# Patient Record
Sex: Female | Born: 1970 | Race: White | Hispanic: No | Marital: Married
Health system: Southern US, Community
[De-identification: ages and names within clinical notes are randomized; demographics above are authoritative.]

## PROBLEM LIST (undated history)

## (undated) HISTORY — PX: ABDOMINAL HYSTERECTOMY: SHX81

## (undated) HISTORY — PX: BACK SURGERY: SHX140

## (undated) HISTORY — PX: APPENDECTOMY: SHX54

## (undated) HISTORY — PX: IMPLANTATION / PLACEMENT EPIDURAL NEUROSTIMULATOR ELECTRODES: SUR687

## (undated) HISTORY — PX: GALLBLADDER SURGERY: SHX652

---

## 2004-11-05 ENCOUNTER — Ambulatory Visit: Payer: Self-pay | Admitting: Hematology

## 2005-04-29 ENCOUNTER — Ambulatory Visit: Payer: Self-pay | Admitting: Oncology

## 2009-01-16 ENCOUNTER — Encounter
Admission: RE | Admit: 2009-01-16 | Discharge: 2009-01-16 | Payer: Self-pay | Admitting: Physical Medicine and Rehabilitation

## 2009-03-20 ENCOUNTER — Inpatient Hospital Stay (HOSPITAL_COMMUNITY): Admission: RE | Admit: 2009-03-20 | Discharge: 2009-03-22 | Payer: Self-pay | Admitting: Orthopedic Surgery

## 2009-07-15 ENCOUNTER — Encounter: Admission: RE | Admit: 2009-07-15 | Discharge: 2009-07-15 | Payer: Self-pay | Admitting: Orthopedic Surgery

## 2010-04-09 ENCOUNTER — Encounter: Admission: RE | Admit: 2010-04-09 | Discharge: 2010-04-09 | Payer: Self-pay | Admitting: Orthopedic Surgery

## 2010-09-23 LAB — TYPE AND SCREEN
ABO/RH(D): O NEG
Antibody Screen: NEGATIVE

## 2010-09-23 LAB — ABO/RH: ABO/RH(D): O NEG

## 2010-09-24 LAB — CBC
HCT: 38 % (ref 36.0–46.0)
Platelets: 346 10*3/uL (ref 150–400)
RDW: 12.8 % (ref 11.5–15.5)

## 2010-09-24 LAB — HCG, SERUM, QUALITATIVE: Preg, Serum: NEGATIVE

## 2011-10-26 ENCOUNTER — Other Ambulatory Visit: Payer: Self-pay | Admitting: Orthopedic Surgery

## 2011-10-26 DIAGNOSIS — M545 Low back pain, unspecified: Secondary | ICD-10-CM

## 2011-11-01 ENCOUNTER — Ambulatory Visit
Admission: RE | Admit: 2011-11-01 | Discharge: 2011-11-01 | Disposition: A | Discharge status: Home / Self Care | Payer: BC Managed Care – HMO | Source: Ambulatory Visit | Attending: Orthopedic Surgery | Admitting: Orthopedic Surgery

## 2011-11-01 VITALS — BP 111/68 | HR 72 | Ht 64.0 in | Wt 175.0 lb

## 2011-11-01 DIAGNOSIS — M545 Low back pain, unspecified: Secondary | ICD-10-CM

## 2011-11-01 MED ORDER — DIAZEPAM 5 MG PO TABS
10.0000 mg | ORAL_TABLET | Freq: Once | ORAL | Status: AC
  Administered 2011-11-01: 10 mg via ORAL

## 2011-11-01 MED ORDER — ONDANSETRON HCL 4 MG/2ML IJ SOLN
4.0000 mg | Freq: Once | INTRAMUSCULAR | Status: AC
  Administered 2011-11-01: 4 mg via INTRAMUSCULAR

## 2011-11-01 MED ORDER — MEPERIDINE HCL 100 MG/ML IJ SOLN
75.0000 mg | Freq: Once | INTRAMUSCULAR | Status: AC
  Administered 2011-11-01: 75 mg via INTRAMUSCULAR

## 2011-11-01 MED ORDER — IOHEXOL 180 MG/ML  SOLN
17.0000 mL | Freq: Once | INTRAMUSCULAR | Status: AC | PRN
  Administered 2011-11-01: 17 mL via INTRATHECAL

## 2011-11-01 NOTE — Discharge Instructions (Signed)
Myelogram Discharge Instructions  1. Go home and rest quietly for the next 24 hours.  It is important to lie flat for the next 24 hours.  Get up only to go to the restroom.  You may lie in the bed or on a couch on your back, your stomach, your left side or your right side.  You may have one pillow under your head.  You may have pillows between your knees while you are on your side or under your knees while you are on your back.  2. DO NOT drive today.  Recline the seat as far back as it will go, while still wearing your seat belt, on the way home.  3. You may get up to go to the bathroom as needed.  You may sit up for 10 minutes to eat.  You may resume your normal diet and medications unless otherwise indicated.  Drink lots of extra fluids today and tomorrow.  4. The incidence of headache, nausea, or vomiting is about 5% (one in 20 patients).  If you develop a headache, lie flat and drink plenty of fluids until the headache goes away.  Caffeinated beverages may be helpful.  If you develop severe nausea and vomiting or a headache that does not go away with flat bed rest, call [phone].  5. You may resume normal activities after your 24 hours of bed rest is over; however, do not exert yourself strongly or do any heavy lifting tomorrow. If when you get up you have a headache when standing, go back to bed and force fluids for another 24 hours.  6. Call your physician for a follow-up appointment.  The results of your myelogram will be sent directly to your physician by the following day.  7. If you have any questions or if complications develop after you arrive home, please call [phone].  Discharge instructions have been explained to the patient.  The patient, or the person responsible for the patient, fully understands these instructions.       May resume sumatriptan on Nov 02, 2011, after 11:00 am.

## 2011-11-01 NOTE — Progress Notes (Signed)
Patient states she has been off Sumatriptan for the past two days.  Procedure and discharge instructions explained to patient.  Larina Earthly, RN

## 2013-09-30 ENCOUNTER — Other Ambulatory Visit: Payer: Self-pay | Admitting: Pain Medicine

## 2013-09-30 ENCOUNTER — Other Ambulatory Visit (HOSPITAL_COMMUNITY): Payer: Self-pay | Admitting: Pain Medicine

## 2013-09-30 ENCOUNTER — Ambulatory Visit
Admission: RE | Admit: 2013-09-30 | Discharge: 2013-09-30 | Disposition: A | Discharge status: Home / Self Care | Payer: Medicare Other | Source: Ambulatory Visit | Attending: Pain Medicine | Admitting: Pain Medicine

## 2013-09-30 DIAGNOSIS — M549 Dorsalgia, unspecified: Secondary | ICD-10-CM

## 2013-11-18 ENCOUNTER — Other Ambulatory Visit: Payer: Self-pay | Admitting: Pain Medicine

## 2013-11-18 DIAGNOSIS — M79604 Pain in right leg: Secondary | ICD-10-CM

## 2013-11-18 DIAGNOSIS — M545 Low back pain, unspecified: Secondary | ICD-10-CM

## 2013-11-21 ENCOUNTER — Ambulatory Visit (HOSPITAL_COMMUNITY)
Admission: RE | Admit: 2013-11-21 | Discharge: 2013-11-21 | Disposition: A | Discharge status: Home / Self Care | Payer: Medicare Other | Source: Ambulatory Visit | Attending: Pain Medicine | Admitting: Pain Medicine

## 2013-11-21 DIAGNOSIS — M79604 Pain in right leg: Secondary | ICD-10-CM

## 2013-11-21 DIAGNOSIS — M5126 Other intervertebral disc displacement, lumbar region: Secondary | ICD-10-CM | POA: Insufficient documentation

## 2013-11-21 DIAGNOSIS — Z981 Arthrodesis status: Secondary | ICD-10-CM | POA: Insufficient documentation

## 2013-11-21 DIAGNOSIS — M545 Low back pain, unspecified: Secondary | ICD-10-CM | POA: Insufficient documentation

## 2013-11-21 MED ORDER — GADOBENATE DIMEGLUMINE 529 MG/ML IV SOLN
17.0000 mL | Freq: Once | INTRAVENOUS | Status: AC | PRN
  Administered 2013-11-21: 17 mL via INTRAVENOUS

## 2013-11-24 ENCOUNTER — Other Ambulatory Visit: Payer: Medicare Other

## 2013-11-28 ENCOUNTER — Ambulatory Visit
Admission: RE | Admit: 2013-11-28 | Discharge: 2013-11-28 | Disposition: A | Discharge status: Home / Self Care | Payer: Medicare Other | Source: Ambulatory Visit | Attending: Pain Medicine | Admitting: Pain Medicine

## 2013-11-28 ENCOUNTER — Other Ambulatory Visit: Payer: Self-pay | Admitting: Pain Medicine

## 2013-11-28 DIAGNOSIS — M25551 Pain in right hip: Secondary | ICD-10-CM

## 2013-11-28 DIAGNOSIS — M25552 Pain in left hip: Principal | ICD-10-CM

## 2014-03-07 ENCOUNTER — Telehealth: Payer: Self-pay | Admitting: Cardiology

## 2014-03-07 NOTE — Telephone Encounter (Signed)
Walk In Pt Form " Tax Forms & Bank Info" Dropped Off gave to Ivy   9.18.15/km

## 2015-04-08 ENCOUNTER — Other Ambulatory Visit (HOSPITAL_COMMUNITY): Payer: Self-pay | Admitting: Internal Medicine

## 2015-04-08 DIAGNOSIS — E2749 Other adrenocortical insufficiency: Secondary | ICD-10-CM

## 2015-04-08 DIAGNOSIS — R51 Headache: Secondary | ICD-10-CM

## 2015-04-08 DIAGNOSIS — R519 Headache, unspecified: Secondary | ICD-10-CM

## 2015-04-23 ENCOUNTER — Ambulatory Visit (HOSPITAL_COMMUNITY)
Admission: RE | Admit: 2015-04-23 | Discharge: 2015-04-23 | Disposition: A | Discharge status: Home / Self Care | Payer: Medicare Other | Source: Ambulatory Visit | Attending: Internal Medicine | Admitting: Internal Medicine

## 2015-04-23 ENCOUNTER — Encounter (HOSPITAL_COMMUNITY): Payer: Self-pay

## 2015-04-23 DIAGNOSIS — R519 Headache, unspecified: Secondary | ICD-10-CM

## 2015-04-23 DIAGNOSIS — R51 Headache: Secondary | ICD-10-CM | POA: Insufficient documentation

## 2015-04-23 DIAGNOSIS — G4700 Insomnia, unspecified: Secondary | ICD-10-CM | POA: Insufficient documentation

## 2015-04-23 DIAGNOSIS — E2749 Other adrenocortical insufficiency: Secondary | ICD-10-CM | POA: Diagnosis not present

## 2015-04-23 MED ORDER — GADOBENATE DIMEGLUMINE 529 MG/ML IV SOLN
9.0000 mL | Freq: Once | INTRAVENOUS | Status: AC | PRN
  Administered 2015-04-23: 9 mL via INTRAVENOUS

## 2015-08-05 DIAGNOSIS — G894 Chronic pain syndrome: Secondary | ICD-10-CM | POA: Diagnosis not present

## 2015-08-05 DIAGNOSIS — M5417 Radiculopathy, lumbosacral region: Secondary | ICD-10-CM | POA: Diagnosis not present

## 2015-08-05 DIAGNOSIS — Z79899 Other long term (current) drug therapy: Secondary | ICD-10-CM | POA: Diagnosis not present

## 2015-08-05 DIAGNOSIS — M5137 Other intervertebral disc degeneration, lumbosacral region: Secondary | ICD-10-CM | POA: Diagnosis not present

## 2015-08-05 DIAGNOSIS — M47817 Spondylosis without myelopathy or radiculopathy, lumbosacral region: Secondary | ICD-10-CM | POA: Diagnosis not present

## 2015-08-19 DIAGNOSIS — M5137 Other intervertebral disc degeneration, lumbosacral region: Secondary | ICD-10-CM | POA: Diagnosis not present

## 2015-08-28 DIAGNOSIS — G2581 Restless legs syndrome: Secondary | ICD-10-CM | POA: Diagnosis not present

## 2015-08-28 DIAGNOSIS — E242 Drug-induced Cushing's syndrome: Secondary | ICD-10-CM | POA: Diagnosis not present

## 2015-08-28 DIAGNOSIS — Z5181 Encounter for therapeutic drug level monitoring: Secondary | ICD-10-CM | POA: Diagnosis not present

## 2015-08-28 DIAGNOSIS — E2749 Other adrenocortical insufficiency: Secondary | ICD-10-CM | POA: Diagnosis not present

## 2015-09-02 DIAGNOSIS — M5137 Other intervertebral disc degeneration, lumbosacral region: Secondary | ICD-10-CM | POA: Diagnosis not present

## 2015-09-02 DIAGNOSIS — E669 Obesity, unspecified: Secondary | ICD-10-CM | POA: Diagnosis not present

## 2015-09-02 DIAGNOSIS — M5417 Radiculopathy, lumbosacral region: Secondary | ICD-10-CM | POA: Diagnosis not present

## 2015-09-02 DIAGNOSIS — M47817 Spondylosis without myelopathy or radiculopathy, lumbosacral region: Secondary | ICD-10-CM | POA: Diagnosis not present

## 2015-09-02 DIAGNOSIS — G894 Chronic pain syndrome: Secondary | ICD-10-CM | POA: Diagnosis not present

## 2015-09-02 DIAGNOSIS — Z79899 Other long term (current) drug therapy: Secondary | ICD-10-CM | POA: Diagnosis not present

## 2015-09-10 DIAGNOSIS — J029 Acute pharyngitis, unspecified: Secondary | ICD-10-CM | POA: Diagnosis not present

## 2015-09-16 DIAGNOSIS — M5137 Other intervertebral disc degeneration, lumbosacral region: Secondary | ICD-10-CM | POA: Diagnosis not present

## 2015-09-30 DIAGNOSIS — E669 Obesity, unspecified: Secondary | ICD-10-CM | POA: Diagnosis not present

## 2015-09-30 DIAGNOSIS — M5137 Other intervertebral disc degeneration, lumbosacral region: Secondary | ICD-10-CM | POA: Diagnosis not present

## 2015-09-30 DIAGNOSIS — Z79899 Other long term (current) drug therapy: Secondary | ICD-10-CM | POA: Diagnosis not present

## 2015-09-30 DIAGNOSIS — M25559 Pain in unspecified hip: Secondary | ICD-10-CM | POA: Diagnosis not present

## 2015-09-30 DIAGNOSIS — M47817 Spondylosis without myelopathy or radiculopathy, lumbosacral region: Secondary | ICD-10-CM | POA: Diagnosis not present

## 2015-09-30 DIAGNOSIS — G894 Chronic pain syndrome: Secondary | ICD-10-CM | POA: Diagnosis not present

## 2015-09-30 DIAGNOSIS — Z79891 Long term (current) use of opiate analgesic: Secondary | ICD-10-CM | POA: Diagnosis not present

## 2015-10-29 DIAGNOSIS — G894 Chronic pain syndrome: Secondary | ICD-10-CM | POA: Diagnosis not present

## 2015-10-29 DIAGNOSIS — M5137 Other intervertebral disc degeneration, lumbosacral region: Secondary | ICD-10-CM | POA: Diagnosis not present

## 2015-10-29 DIAGNOSIS — Z79891 Long term (current) use of opiate analgesic: Secondary | ICD-10-CM | POA: Diagnosis not present

## 2015-10-29 DIAGNOSIS — M47817 Spondylosis without myelopathy or radiculopathy, lumbosacral region: Secondary | ICD-10-CM | POA: Diagnosis not present

## 2015-10-29 DIAGNOSIS — M25559 Pain in unspecified hip: Secondary | ICD-10-CM | POA: Diagnosis not present

## 2015-10-29 DIAGNOSIS — Z79899 Other long term (current) drug therapy: Secondary | ICD-10-CM | POA: Diagnosis not present

## 2015-11-26 DIAGNOSIS — Z79899 Other long term (current) drug therapy: Secondary | ICD-10-CM | POA: Diagnosis not present

## 2015-11-26 DIAGNOSIS — E669 Obesity, unspecified: Secondary | ICD-10-CM | POA: Diagnosis not present

## 2015-11-26 DIAGNOSIS — M5137 Other intervertebral disc degeneration, lumbosacral region: Secondary | ICD-10-CM | POA: Diagnosis not present

## 2015-11-26 DIAGNOSIS — G894 Chronic pain syndrome: Secondary | ICD-10-CM | POA: Diagnosis not present

## 2015-11-26 DIAGNOSIS — Z79891 Long term (current) use of opiate analgesic: Secondary | ICD-10-CM | POA: Diagnosis not present

## 2015-11-26 DIAGNOSIS — M25559 Pain in unspecified hip: Secondary | ICD-10-CM | POA: Diagnosis not present

## 2015-11-26 DIAGNOSIS — M47817 Spondylosis without myelopathy or radiculopathy, lumbosacral region: Secondary | ICD-10-CM | POA: Diagnosis not present

## 2015-12-02 DIAGNOSIS — M7060 Trochanteric bursitis, unspecified hip: Secondary | ICD-10-CM | POA: Diagnosis not present

## 2015-12-07 DIAGNOSIS — Z5181 Encounter for therapeutic drug level monitoring: Secondary | ICD-10-CM | POA: Diagnosis not present

## 2015-12-07 DIAGNOSIS — E242 Drug-induced Cushing's syndrome: Secondary | ICD-10-CM | POA: Diagnosis not present

## 2015-12-07 DIAGNOSIS — E2749 Other adrenocortical insufficiency: Secondary | ICD-10-CM | POA: Diagnosis not present

## 2015-12-07 DIAGNOSIS — G2581 Restless legs syndrome: Secondary | ICD-10-CM | POA: Diagnosis not present

## 2015-12-08 DIAGNOSIS — R232 Flushing: Secondary | ICD-10-CM | POA: Diagnosis not present

## 2015-12-08 DIAGNOSIS — E039 Hypothyroidism, unspecified: Secondary | ICD-10-CM | POA: Diagnosis not present

## 2015-12-08 DIAGNOSIS — I10 Essential (primary) hypertension: Secondary | ICD-10-CM | POA: Diagnosis not present

## 2015-12-08 DIAGNOSIS — G2581 Restless legs syndrome: Secondary | ICD-10-CM | POA: Diagnosis not present

## 2015-12-09 DIAGNOSIS — N951 Menopausal and female climacteric states: Secondary | ICD-10-CM | POA: Diagnosis not present

## 2015-12-09 DIAGNOSIS — E876 Hypokalemia: Secondary | ICD-10-CM | POA: Diagnosis not present

## 2015-12-09 DIAGNOSIS — I10 Essential (primary) hypertension: Secondary | ICD-10-CM | POA: Diagnosis not present

## 2015-12-09 DIAGNOSIS — Z79899 Other long term (current) drug therapy: Secondary | ICD-10-CM | POA: Diagnosis not present

## 2015-12-09 DIAGNOSIS — D649 Anemia, unspecified: Secondary | ICD-10-CM | POA: Diagnosis not present

## 2015-12-09 DIAGNOSIS — G2581 Restless legs syndrome: Secondary | ICD-10-CM | POA: Diagnosis not present

## 2015-12-09 DIAGNOSIS — E785 Hyperlipidemia, unspecified: Secondary | ICD-10-CM | POA: Diagnosis not present

## 2015-12-09 DIAGNOSIS — E039 Hypothyroidism, unspecified: Secondary | ICD-10-CM | POA: Diagnosis not present

## 2015-12-24 DIAGNOSIS — G894 Chronic pain syndrome: Secondary | ICD-10-CM | POA: Diagnosis not present

## 2015-12-24 DIAGNOSIS — Z79899 Other long term (current) drug therapy: Secondary | ICD-10-CM | POA: Diagnosis not present

## 2015-12-24 DIAGNOSIS — E669 Obesity, unspecified: Secondary | ICD-10-CM | POA: Diagnosis not present

## 2015-12-24 DIAGNOSIS — M47817 Spondylosis without myelopathy or radiculopathy, lumbosacral region: Secondary | ICD-10-CM | POA: Diagnosis not present

## 2015-12-24 DIAGNOSIS — Z79891 Long term (current) use of opiate analgesic: Secondary | ICD-10-CM | POA: Diagnosis not present

## 2015-12-24 DIAGNOSIS — M25559 Pain in unspecified hip: Secondary | ICD-10-CM | POA: Diagnosis not present

## 2015-12-24 DIAGNOSIS — M5137 Other intervertebral disc degeneration, lumbosacral region: Secondary | ICD-10-CM | POA: Diagnosis not present

## 2016-01-14 DIAGNOSIS — R195 Other fecal abnormalities: Secondary | ICD-10-CM | POA: Diagnosis not present

## 2016-01-14 DIAGNOSIS — Z5181 Encounter for therapeutic drug level monitoring: Secondary | ICD-10-CM | POA: Diagnosis not present

## 2016-01-14 DIAGNOSIS — E242 Drug-induced Cushing's syndrome: Secondary | ICD-10-CM | POA: Diagnosis not present

## 2016-01-14 DIAGNOSIS — E2749 Other adrenocortical insufficiency: Secondary | ICD-10-CM | POA: Diagnosis not present

## 2016-01-14 DIAGNOSIS — R109 Unspecified abdominal pain: Secondary | ICD-10-CM | POA: Diagnosis not present

## 2016-01-14 DIAGNOSIS — R1110 Vomiting, unspecified: Secondary | ICD-10-CM | POA: Diagnosis not present

## 2016-01-21 DIAGNOSIS — M47817 Spondylosis without myelopathy or radiculopathy, lumbosacral region: Secondary | ICD-10-CM | POA: Diagnosis not present

## 2016-01-21 DIAGNOSIS — M5417 Radiculopathy, lumbosacral region: Secondary | ICD-10-CM | POA: Diagnosis not present

## 2016-01-21 DIAGNOSIS — M5137 Other intervertebral disc degeneration, lumbosacral region: Secondary | ICD-10-CM | POA: Diagnosis not present

## 2016-01-21 DIAGNOSIS — Z79891 Long term (current) use of opiate analgesic: Secondary | ICD-10-CM | POA: Diagnosis not present

## 2016-01-21 DIAGNOSIS — G894 Chronic pain syndrome: Secondary | ICD-10-CM | POA: Diagnosis not present

## 2016-01-21 DIAGNOSIS — Z79899 Other long term (current) drug therapy: Secondary | ICD-10-CM | POA: Diagnosis not present

## 2016-02-04 DIAGNOSIS — G609 Hereditary and idiopathic neuropathy, unspecified: Secondary | ICD-10-CM | POA: Diagnosis not present

## 2016-02-04 DIAGNOSIS — F4542 Pain disorder with related psychological factors: Secondary | ICD-10-CM | POA: Diagnosis not present

## 2016-02-04 DIAGNOSIS — G894 Chronic pain syndrome: Secondary | ICD-10-CM | POA: Diagnosis not present

## 2016-02-04 DIAGNOSIS — M792 Neuralgia and neuritis, unspecified: Secondary | ICD-10-CM | POA: Diagnosis not present

## 2016-02-17 DIAGNOSIS — E242 Drug-induced Cushing's syndrome: Secondary | ICD-10-CM | POA: Diagnosis not present

## 2016-02-17 DIAGNOSIS — E2749 Other adrenocortical insufficiency: Secondary | ICD-10-CM | POA: Diagnosis not present

## 2016-02-17 DIAGNOSIS — Z5181 Encounter for therapeutic drug level monitoring: Secondary | ICD-10-CM | POA: Diagnosis not present

## 2016-02-18 DIAGNOSIS — Z79899 Other long term (current) drug therapy: Secondary | ICD-10-CM | POA: Diagnosis not present

## 2016-02-18 DIAGNOSIS — Z79891 Long term (current) use of opiate analgesic: Secondary | ICD-10-CM | POA: Diagnosis not present

## 2016-02-18 DIAGNOSIS — M47817 Spondylosis without myelopathy or radiculopathy, lumbosacral region: Secondary | ICD-10-CM | POA: Diagnosis not present

## 2016-02-18 DIAGNOSIS — M25559 Pain in unspecified hip: Secondary | ICD-10-CM | POA: Diagnosis not present

## 2016-02-18 DIAGNOSIS — M5137 Other intervertebral disc degeneration, lumbosacral region: Secondary | ICD-10-CM | POA: Diagnosis not present

## 2016-02-18 DIAGNOSIS — G894 Chronic pain syndrome: Secondary | ICD-10-CM | POA: Diagnosis not present

## 2016-02-24 DIAGNOSIS — M7989 Other specified soft tissue disorders: Secondary | ICD-10-CM | POA: Diagnosis not present

## 2016-02-24 DIAGNOSIS — R1032 Left lower quadrant pain: Secondary | ICD-10-CM | POA: Diagnosis not present

## 2016-03-17 DIAGNOSIS — M5137 Other intervertebral disc degeneration, lumbosacral region: Secondary | ICD-10-CM | POA: Diagnosis not present

## 2016-03-17 DIAGNOSIS — M5417 Radiculopathy, lumbosacral region: Secondary | ICD-10-CM | POA: Diagnosis not present

## 2016-03-17 DIAGNOSIS — Z79899 Other long term (current) drug therapy: Secondary | ICD-10-CM | POA: Diagnosis not present

## 2016-03-17 DIAGNOSIS — Z79891 Long term (current) use of opiate analgesic: Secondary | ICD-10-CM | POA: Diagnosis not present

## 2016-03-17 DIAGNOSIS — G894 Chronic pain syndrome: Secondary | ICD-10-CM | POA: Diagnosis not present

## 2016-03-17 DIAGNOSIS — M47817 Spondylosis without myelopathy or radiculopathy, lumbosacral region: Secondary | ICD-10-CM | POA: Diagnosis not present

## 2016-03-18 DIAGNOSIS — E785 Hyperlipidemia, unspecified: Secondary | ICD-10-CM | POA: Diagnosis not present

## 2016-03-18 DIAGNOSIS — E079 Disorder of thyroid, unspecified: Secondary | ICD-10-CM | POA: Diagnosis not present

## 2016-03-18 DIAGNOSIS — R2241 Localized swelling, mass and lump, right lower limb: Secondary | ICD-10-CM | POA: Diagnosis not present

## 2016-03-18 DIAGNOSIS — R2242 Localized swelling, mass and lump, left lower limb: Secondary | ICD-10-CM | POA: Diagnosis not present

## 2016-03-18 DIAGNOSIS — M545 Low back pain: Secondary | ICD-10-CM | POA: Diagnosis not present

## 2016-03-18 DIAGNOSIS — M7989 Other specified soft tissue disorders: Secondary | ICD-10-CM | POA: Diagnosis not present

## 2016-03-24 DIAGNOSIS — D72829 Elevated white blood cell count, unspecified: Secondary | ICD-10-CM | POA: Diagnosis not present

## 2016-04-14 DIAGNOSIS — M47817 Spondylosis without myelopathy or radiculopathy, lumbosacral region: Secondary | ICD-10-CM | POA: Diagnosis not present

## 2016-04-14 DIAGNOSIS — M5137 Other intervertebral disc degeneration, lumbosacral region: Secondary | ICD-10-CM | POA: Diagnosis not present

## 2016-04-14 DIAGNOSIS — Z79899 Other long term (current) drug therapy: Secondary | ICD-10-CM | POA: Diagnosis not present

## 2016-04-14 DIAGNOSIS — M5417 Radiculopathy, lumbosacral region: Secondary | ICD-10-CM | POA: Diagnosis not present

## 2016-04-14 DIAGNOSIS — G894 Chronic pain syndrome: Secondary | ICD-10-CM | POA: Diagnosis not present

## 2016-04-14 DIAGNOSIS — Z79891 Long term (current) use of opiate analgesic: Secondary | ICD-10-CM | POA: Diagnosis not present

## 2016-05-10 DIAGNOSIS — R1110 Vomiting, unspecified: Secondary | ICD-10-CM | POA: Diagnosis not present

## 2016-05-10 DIAGNOSIS — K219 Gastro-esophageal reflux disease without esophagitis: Secondary | ICD-10-CM | POA: Diagnosis not present

## 2016-05-11 DIAGNOSIS — Z79891 Long term (current) use of opiate analgesic: Secondary | ICD-10-CM | POA: Diagnosis not present

## 2016-05-11 DIAGNOSIS — M5417 Radiculopathy, lumbosacral region: Secondary | ICD-10-CM | POA: Diagnosis not present

## 2016-05-11 DIAGNOSIS — M5137 Other intervertebral disc degeneration, lumbosacral region: Secondary | ICD-10-CM | POA: Diagnosis not present

## 2016-05-11 DIAGNOSIS — M47817 Spondylosis without myelopathy or radiculopathy, lumbosacral region: Secondary | ICD-10-CM | POA: Diagnosis not present

## 2016-05-11 DIAGNOSIS — G894 Chronic pain syndrome: Secondary | ICD-10-CM | POA: Diagnosis not present

## 2016-05-11 DIAGNOSIS — Z79899 Other long term (current) drug therapy: Secondary | ICD-10-CM | POA: Diagnosis not present

## 2016-05-27 DIAGNOSIS — R0602 Shortness of breath: Secondary | ICD-10-CM | POA: Diagnosis not present

## 2016-05-27 DIAGNOSIS — R110 Nausea: Secondary | ICD-10-CM | POA: Diagnosis not present

## 2016-06-09 DIAGNOSIS — Z79899 Other long term (current) drug therapy: Secondary | ICD-10-CM | POA: Diagnosis not present

## 2016-06-09 DIAGNOSIS — Z79891 Long term (current) use of opiate analgesic: Secondary | ICD-10-CM | POA: Diagnosis not present

## 2016-06-09 DIAGNOSIS — G894 Chronic pain syndrome: Secondary | ICD-10-CM | POA: Diagnosis not present

## 2016-06-09 DIAGNOSIS — M5137 Other intervertebral disc degeneration, lumbosacral region: Secondary | ICD-10-CM | POA: Diagnosis not present

## 2016-06-09 DIAGNOSIS — M47817 Spondylosis without myelopathy or radiculopathy, lumbosacral region: Secondary | ICD-10-CM | POA: Diagnosis not present

## 2016-06-09 DIAGNOSIS — M5417 Radiculopathy, lumbosacral region: Secondary | ICD-10-CM | POA: Diagnosis not present

## 2016-07-11 DIAGNOSIS — M5417 Radiculopathy, lumbosacral region: Secondary | ICD-10-CM | POA: Diagnosis not present

## 2016-07-11 DIAGNOSIS — M5137 Other intervertebral disc degeneration, lumbosacral region: Secondary | ICD-10-CM | POA: Diagnosis not present

## 2016-07-11 DIAGNOSIS — M47817 Spondylosis without myelopathy or radiculopathy, lumbosacral region: Secondary | ICD-10-CM | POA: Diagnosis not present

## 2016-07-11 DIAGNOSIS — G894 Chronic pain syndrome: Secondary | ICD-10-CM | POA: Diagnosis not present

## 2016-07-11 DIAGNOSIS — Z79891 Long term (current) use of opiate analgesic: Secondary | ICD-10-CM | POA: Diagnosis not present

## 2016-07-11 DIAGNOSIS — Z79899 Other long term (current) drug therapy: Secondary | ICD-10-CM | POA: Diagnosis not present

## 2016-07-18 DIAGNOSIS — M5417 Radiculopathy, lumbosacral region: Secondary | ICD-10-CM | POA: Diagnosis not present

## 2016-07-18 DIAGNOSIS — M5137 Other intervertebral disc degeneration, lumbosacral region: Secondary | ICD-10-CM | POA: Diagnosis not present

## 2016-07-18 DIAGNOSIS — M545 Low back pain: Secondary | ICD-10-CM | POA: Diagnosis not present

## 2016-07-18 DIAGNOSIS — M47817 Spondylosis without myelopathy or radiculopathy, lumbosacral region: Secondary | ICD-10-CM | POA: Diagnosis not present

## 2016-08-08 DIAGNOSIS — M5417 Radiculopathy, lumbosacral region: Secondary | ICD-10-CM | POA: Diagnosis not present

## 2016-08-08 DIAGNOSIS — G894 Chronic pain syndrome: Secondary | ICD-10-CM | POA: Diagnosis not present

## 2016-08-08 DIAGNOSIS — M47817 Spondylosis without myelopathy or radiculopathy, lumbosacral region: Secondary | ICD-10-CM | POA: Diagnosis not present

## 2016-08-08 DIAGNOSIS — M5137 Other intervertebral disc degeneration, lumbosacral region: Secondary | ICD-10-CM | POA: Diagnosis not present

## 2016-08-08 DIAGNOSIS — Z79899 Other long term (current) drug therapy: Secondary | ICD-10-CM | POA: Diagnosis not present

## 2016-08-08 DIAGNOSIS — Z79891 Long term (current) use of opiate analgesic: Secondary | ICD-10-CM | POA: Diagnosis not present

## 2016-09-05 DIAGNOSIS — M5417 Radiculopathy, lumbosacral region: Secondary | ICD-10-CM | POA: Diagnosis not present

## 2016-09-05 DIAGNOSIS — Z79891 Long term (current) use of opiate analgesic: Secondary | ICD-10-CM | POA: Diagnosis not present

## 2016-09-05 DIAGNOSIS — G894 Chronic pain syndrome: Secondary | ICD-10-CM | POA: Diagnosis not present

## 2016-09-05 DIAGNOSIS — M25559 Pain in unspecified hip: Secondary | ICD-10-CM | POA: Diagnosis not present

## 2016-09-05 DIAGNOSIS — M5137 Other intervertebral disc degeneration, lumbosacral region: Secondary | ICD-10-CM | POA: Diagnosis not present

## 2016-09-05 DIAGNOSIS — Z79899 Other long term (current) drug therapy: Secondary | ICD-10-CM | POA: Diagnosis not present

## 2016-09-12 DIAGNOSIS — M5137 Other intervertebral disc degeneration, lumbosacral region: Secondary | ICD-10-CM | POA: Diagnosis not present

## 2016-09-12 DIAGNOSIS — M5417 Radiculopathy, lumbosacral region: Secondary | ICD-10-CM | POA: Diagnosis not present

## 2016-09-12 DIAGNOSIS — M47817 Spondylosis without myelopathy or radiculopathy, lumbosacral region: Secondary | ICD-10-CM | POA: Diagnosis not present

## 2016-09-13 DIAGNOSIS — M549 Dorsalgia, unspecified: Secondary | ICD-10-CM | POA: Diagnosis not present

## 2016-09-13 DIAGNOSIS — G43109 Migraine with aura, not intractable, without status migrainosus: Secondary | ICD-10-CM | POA: Diagnosis not present

## 2016-09-13 DIAGNOSIS — E785 Hyperlipidemia, unspecified: Secondary | ICD-10-CM | POA: Diagnosis not present

## 2016-09-13 DIAGNOSIS — Z6839 Body mass index (BMI) 39.0-39.9, adult: Secondary | ICD-10-CM | POA: Diagnosis not present

## 2016-09-14 DIAGNOSIS — Z79899 Other long term (current) drug therapy: Secondary | ICD-10-CM | POA: Diagnosis not present

## 2016-09-14 DIAGNOSIS — I10 Essential (primary) hypertension: Secondary | ICD-10-CM | POA: Diagnosis not present

## 2016-09-14 DIAGNOSIS — E039 Hypothyroidism, unspecified: Secondary | ICD-10-CM | POA: Diagnosis not present

## 2016-09-14 DIAGNOSIS — E785 Hyperlipidemia, unspecified: Secondary | ICD-10-CM | POA: Diagnosis not present

## 2016-10-03 DIAGNOSIS — Z79899 Other long term (current) drug therapy: Secondary | ICD-10-CM | POA: Diagnosis not present

## 2016-10-03 DIAGNOSIS — G894 Chronic pain syndrome: Secondary | ICD-10-CM | POA: Diagnosis not present

## 2016-10-03 DIAGNOSIS — M47817 Spondylosis without myelopathy or radiculopathy, lumbosacral region: Secondary | ICD-10-CM | POA: Diagnosis not present

## 2016-10-03 DIAGNOSIS — Z79891 Long term (current) use of opiate analgesic: Secondary | ICD-10-CM | POA: Diagnosis not present

## 2016-10-03 DIAGNOSIS — M5417 Radiculopathy, lumbosacral region: Secondary | ICD-10-CM | POA: Diagnosis not present

## 2016-10-03 DIAGNOSIS — M5137 Other intervertebral disc degeneration, lumbosacral region: Secondary | ICD-10-CM | POA: Diagnosis not present

## 2016-10-04 ENCOUNTER — Other Ambulatory Visit: Payer: Self-pay | Admitting: Pain Medicine

## 2016-10-04 DIAGNOSIS — M79605 Pain in left leg: Secondary | ICD-10-CM

## 2016-10-04 DIAGNOSIS — M545 Low back pain: Secondary | ICD-10-CM

## 2016-10-04 DIAGNOSIS — M79604 Pain in right leg: Secondary | ICD-10-CM

## 2016-10-14 ENCOUNTER — Ambulatory Visit (HOSPITAL_COMMUNITY)
Admission: RE | Admit: 2016-10-14 | Discharge: 2016-10-14 | Disposition: A | Discharge status: Home / Self Care | Payer: Medicare Other | Source: Ambulatory Visit | Attending: Pain Medicine | Admitting: Pain Medicine

## 2016-10-14 DIAGNOSIS — M79605 Pain in left leg: Secondary | ICD-10-CM | POA: Diagnosis present

## 2016-10-14 DIAGNOSIS — M545 Low back pain: Secondary | ICD-10-CM

## 2016-10-14 DIAGNOSIS — M79604 Pain in right leg: Secondary | ICD-10-CM | POA: Diagnosis present

## 2016-10-21 DIAGNOSIS — Z5181 Encounter for therapeutic drug level monitoring: Secondary | ICD-10-CM | POA: Diagnosis not present

## 2016-10-21 DIAGNOSIS — E2749 Other adrenocortical insufficiency: Secondary | ICD-10-CM | POA: Diagnosis not present

## 2016-10-21 DIAGNOSIS — E242 Drug-induced Cushing's syndrome: Secondary | ICD-10-CM | POA: Diagnosis not present

## 2016-10-24 DIAGNOSIS — N951 Menopausal and female climacteric states: Secondary | ICD-10-CM | POA: Diagnosis not present

## 2016-11-04 DIAGNOSIS — Z79890 Hormone replacement therapy (postmenopausal): Secondary | ICD-10-CM | POA: Diagnosis not present

## 2016-11-04 DIAGNOSIS — Z79899 Other long term (current) drug therapy: Secondary | ICD-10-CM | POA: Diagnosis not present

## 2016-11-04 DIAGNOSIS — D72829 Elevated white blood cell count, unspecified: Secondary | ICD-10-CM | POA: Diagnosis not present

## 2016-11-07 DIAGNOSIS — M25559 Pain in unspecified hip: Secondary | ICD-10-CM | POA: Diagnosis not present

## 2016-11-07 DIAGNOSIS — Z79891 Long term (current) use of opiate analgesic: Secondary | ICD-10-CM | POA: Diagnosis not present

## 2016-11-07 DIAGNOSIS — G894 Chronic pain syndrome: Secondary | ICD-10-CM | POA: Diagnosis not present

## 2016-11-07 DIAGNOSIS — M5417 Radiculopathy, lumbosacral region: Secondary | ICD-10-CM | POA: Diagnosis not present

## 2016-11-07 DIAGNOSIS — Z79899 Other long term (current) drug therapy: Secondary | ICD-10-CM | POA: Diagnosis not present

## 2016-11-07 DIAGNOSIS — M5137 Other intervertebral disc degeneration, lumbosacral region: Secondary | ICD-10-CM | POA: Diagnosis not present

## 2016-11-16 DIAGNOSIS — M79671 Pain in right foot: Secondary | ICD-10-CM | POA: Diagnosis not present

## 2016-11-21 DIAGNOSIS — M722 Plantar fascial fibromatosis: Secondary | ICD-10-CM | POA: Diagnosis not present

## 2016-12-05 DIAGNOSIS — E2749 Other adrenocortical insufficiency: Secondary | ICD-10-CM | POA: Diagnosis not present

## 2016-12-05 DIAGNOSIS — Z79899 Other long term (current) drug therapy: Secondary | ICD-10-CM | POA: Diagnosis not present

## 2016-12-05 DIAGNOSIS — E242 Drug-induced Cushing's syndrome: Secondary | ICD-10-CM | POA: Diagnosis not present

## 2016-12-09 DIAGNOSIS — D509 Iron deficiency anemia, unspecified: Secondary | ICD-10-CM | POA: Diagnosis not present

## 2016-12-09 DIAGNOSIS — D72829 Elevated white blood cell count, unspecified: Secondary | ICD-10-CM | POA: Diagnosis not present

## 2016-12-12 DIAGNOSIS — M79673 Pain in unspecified foot: Secondary | ICD-10-CM | POA: Diagnosis not present

## 2016-12-12 DIAGNOSIS — G894 Chronic pain syndrome: Secondary | ICD-10-CM | POA: Diagnosis not present

## 2016-12-12 DIAGNOSIS — M961 Postlaminectomy syndrome, not elsewhere classified: Secondary | ICD-10-CM | POA: Diagnosis not present

## 2016-12-12 DIAGNOSIS — M25559 Pain in unspecified hip: Secondary | ICD-10-CM | POA: Diagnosis not present

## 2016-12-26 DIAGNOSIS — M545 Low back pain: Secondary | ICD-10-CM | POA: Diagnosis not present

## 2016-12-26 DIAGNOSIS — M25559 Pain in unspecified hip: Secondary | ICD-10-CM | POA: Diagnosis not present

## 2016-12-26 DIAGNOSIS — M961 Postlaminectomy syndrome, not elsewhere classified: Secondary | ICD-10-CM | POA: Diagnosis not present

## 2016-12-26 DIAGNOSIS — G894 Chronic pain syndrome: Secondary | ICD-10-CM | POA: Diagnosis not present

## 2016-12-29 DIAGNOSIS — M5137 Other intervertebral disc degeneration, lumbosacral region: Secondary | ICD-10-CM | POA: Diagnosis not present

## 2016-12-29 DIAGNOSIS — M5417 Radiculopathy, lumbosacral region: Secondary | ICD-10-CM | POA: Diagnosis not present

## 2016-12-29 DIAGNOSIS — G894 Chronic pain syndrome: Secondary | ICD-10-CM | POA: Diagnosis not present

## 2016-12-29 DIAGNOSIS — M47817 Spondylosis without myelopathy or radiculopathy, lumbosacral region: Secondary | ICD-10-CM | POA: Diagnosis not present

## 2017-01-03 DIAGNOSIS — M79609 Pain in unspecified limb: Secondary | ICD-10-CM | POA: Diagnosis not present

## 2017-01-03 DIAGNOSIS — M545 Low back pain: Secondary | ICD-10-CM | POA: Diagnosis not present

## 2017-01-16 DIAGNOSIS — E039 Hypothyroidism, unspecified: Secondary | ICD-10-CM | POA: Diagnosis not present

## 2017-01-16 DIAGNOSIS — I10 Essential (primary) hypertension: Secondary | ICD-10-CM | POA: Diagnosis not present

## 2017-01-16 DIAGNOSIS — G43109 Migraine with aura, not intractable, without status migrainosus: Secondary | ICD-10-CM | POA: Diagnosis not present

## 2017-01-16 DIAGNOSIS — K219 Gastro-esophageal reflux disease without esophagitis: Secondary | ICD-10-CM | POA: Diagnosis not present

## 2017-01-17 DIAGNOSIS — Z79899 Other long term (current) drug therapy: Secondary | ICD-10-CM | POA: Diagnosis not present

## 2017-01-17 DIAGNOSIS — E785 Hyperlipidemia, unspecified: Secondary | ICD-10-CM | POA: Diagnosis not present

## 2017-01-17 DIAGNOSIS — E039 Hypothyroidism, unspecified: Secondary | ICD-10-CM | POA: Diagnosis not present

## 2017-01-31 DIAGNOSIS — M5137 Other intervertebral disc degeneration, lumbosacral region: Secondary | ICD-10-CM | POA: Diagnosis not present

## 2017-01-31 DIAGNOSIS — M25559 Pain in unspecified hip: Secondary | ICD-10-CM | POA: Diagnosis not present

## 2017-01-31 DIAGNOSIS — G894 Chronic pain syndrome: Secondary | ICD-10-CM | POA: Diagnosis not present

## 2017-01-31 DIAGNOSIS — Z79899 Other long term (current) drug therapy: Secondary | ICD-10-CM | POA: Diagnosis not present

## 2017-01-31 DIAGNOSIS — Z79891 Long term (current) use of opiate analgesic: Secondary | ICD-10-CM | POA: Diagnosis not present

## 2017-01-31 DIAGNOSIS — M5417 Radiculopathy, lumbosacral region: Secondary | ICD-10-CM | POA: Diagnosis not present

## 2017-02-28 DIAGNOSIS — G894 Chronic pain syndrome: Secondary | ICD-10-CM | POA: Diagnosis not present

## 2017-02-28 DIAGNOSIS — M5137 Other intervertebral disc degeneration, lumbosacral region: Secondary | ICD-10-CM | POA: Diagnosis not present

## 2017-02-28 DIAGNOSIS — M47817 Spondylosis without myelopathy or radiculopathy, lumbosacral region: Secondary | ICD-10-CM | POA: Diagnosis not present

## 2017-02-28 DIAGNOSIS — M5417 Radiculopathy, lumbosacral region: Secondary | ICD-10-CM | POA: Diagnosis not present

## 2017-03-08 DIAGNOSIS — E2749 Other adrenocortical insufficiency: Secondary | ICD-10-CM | POA: Diagnosis not present

## 2017-03-08 DIAGNOSIS — E242 Drug-induced Cushing's syndrome: Secondary | ICD-10-CM | POA: Diagnosis not present

## 2017-03-08 DIAGNOSIS — Z23 Encounter for immunization: Secondary | ICD-10-CM | POA: Diagnosis not present

## 2017-03-28 DIAGNOSIS — G894 Chronic pain syndrome: Secondary | ICD-10-CM | POA: Diagnosis not present

## 2017-03-28 DIAGNOSIS — M25559 Pain in unspecified hip: Secondary | ICD-10-CM | POA: Diagnosis not present

## 2017-03-28 DIAGNOSIS — M5417 Radiculopathy, lumbosacral region: Secondary | ICD-10-CM | POA: Diagnosis not present

## 2017-03-28 DIAGNOSIS — M5137 Other intervertebral disc degeneration, lumbosacral region: Secondary | ICD-10-CM | POA: Diagnosis not present

## 2017-05-08 DIAGNOSIS — M47817 Spondylosis without myelopathy or radiculopathy, lumbosacral region: Secondary | ICD-10-CM | POA: Diagnosis not present

## 2017-05-08 DIAGNOSIS — M5137 Other intervertebral disc degeneration, lumbosacral region: Secondary | ICD-10-CM | POA: Diagnosis not present

## 2017-05-08 DIAGNOSIS — M5417 Radiculopathy, lumbosacral region: Secondary | ICD-10-CM | POA: Diagnosis not present

## 2017-05-08 DIAGNOSIS — Z79891 Long term (current) use of opiate analgesic: Secondary | ICD-10-CM | POA: Diagnosis not present

## 2017-05-08 DIAGNOSIS — Z79899 Other long term (current) drug therapy: Secondary | ICD-10-CM | POA: Diagnosis not present

## 2017-05-08 DIAGNOSIS — G894 Chronic pain syndrome: Secondary | ICD-10-CM | POA: Diagnosis not present

## 2017-05-19 DIAGNOSIS — Z79899 Other long term (current) drug therapy: Secondary | ICD-10-CM | POA: Diagnosis not present

## 2017-05-19 DIAGNOSIS — G43109 Migraine with aura, not intractable, without status migrainosus: Secondary | ICD-10-CM | POA: Diagnosis not present

## 2017-05-19 DIAGNOSIS — E785 Hyperlipidemia, unspecified: Secondary | ICD-10-CM | POA: Diagnosis not present

## 2017-05-19 DIAGNOSIS — I10 Essential (primary) hypertension: Secondary | ICD-10-CM | POA: Diagnosis not present

## 2017-05-19 DIAGNOSIS — E039 Hypothyroidism, unspecified: Secondary | ICD-10-CM | POA: Diagnosis not present

## 2017-05-23 DIAGNOSIS — Z79899 Other long term (current) drug therapy: Secondary | ICD-10-CM | POA: Diagnosis not present

## 2017-05-23 DIAGNOSIS — Z79891 Long term (current) use of opiate analgesic: Secondary | ICD-10-CM | POA: Diagnosis not present

## 2017-05-23 DIAGNOSIS — G894 Chronic pain syndrome: Secondary | ICD-10-CM | POA: Diagnosis not present

## 2017-06-05 DIAGNOSIS — Z79891 Long term (current) use of opiate analgesic: Secondary | ICD-10-CM | POA: Diagnosis not present

## 2017-06-05 DIAGNOSIS — M5137 Other intervertebral disc degeneration, lumbosacral region: Secondary | ICD-10-CM | POA: Diagnosis not present

## 2017-06-05 DIAGNOSIS — M47816 Spondylosis without myelopathy or radiculopathy, lumbar region: Secondary | ICD-10-CM | POA: Diagnosis not present

## 2017-06-05 DIAGNOSIS — M79606 Pain in leg, unspecified: Secondary | ICD-10-CM | POA: Diagnosis not present

## 2017-06-05 DIAGNOSIS — Z79899 Other long term (current) drug therapy: Secondary | ICD-10-CM | POA: Diagnosis not present

## 2017-06-05 DIAGNOSIS — G894 Chronic pain syndrome: Secondary | ICD-10-CM | POA: Diagnosis not present

## 2017-06-08 DIAGNOSIS — J40 Bronchitis, not specified as acute or chronic: Secondary | ICD-10-CM | POA: Diagnosis not present

## 2017-06-08 DIAGNOSIS — R05 Cough: Secondary | ICD-10-CM | POA: Diagnosis not present

## 2017-07-03 DIAGNOSIS — G894 Chronic pain syndrome: Secondary | ICD-10-CM | POA: Diagnosis not present

## 2017-07-03 DIAGNOSIS — M47816 Spondylosis without myelopathy or radiculopathy, lumbar region: Secondary | ICD-10-CM | POA: Diagnosis not present

## 2017-07-03 DIAGNOSIS — Z79899 Other long term (current) drug therapy: Secondary | ICD-10-CM | POA: Diagnosis not present

## 2017-07-03 DIAGNOSIS — M79606 Pain in leg, unspecified: Secondary | ICD-10-CM | POA: Diagnosis not present

## 2017-07-03 DIAGNOSIS — Z79891 Long term (current) use of opiate analgesic: Secondary | ICD-10-CM | POA: Diagnosis not present

## 2017-07-11 DIAGNOSIS — J029 Acute pharyngitis, unspecified: Secondary | ICD-10-CM | POA: Diagnosis not present

## 2017-07-18 DIAGNOSIS — E039 Hypothyroidism, unspecified: Secondary | ICD-10-CM | POA: Diagnosis not present

## 2017-08-09 DIAGNOSIS — Z8639 Personal history of other endocrine, nutritional and metabolic disease: Secondary | ICD-10-CM | POA: Diagnosis not present

## 2017-08-11 DIAGNOSIS — Z79899 Other long term (current) drug therapy: Secondary | ICD-10-CM | POA: Diagnosis not present

## 2017-08-11 DIAGNOSIS — M25559 Pain in unspecified hip: Secondary | ICD-10-CM | POA: Diagnosis not present

## 2017-08-11 DIAGNOSIS — M5137 Other intervertebral disc degeneration, lumbosacral region: Secondary | ICD-10-CM | POA: Diagnosis not present

## 2017-08-11 DIAGNOSIS — M545 Low back pain: Secondary | ICD-10-CM | POA: Diagnosis not present

## 2017-08-11 DIAGNOSIS — Z79891 Long term (current) use of opiate analgesic: Secondary | ICD-10-CM | POA: Diagnosis not present

## 2017-08-11 DIAGNOSIS — G894 Chronic pain syndrome: Secondary | ICD-10-CM | POA: Diagnosis not present

## 2017-08-29 DIAGNOSIS — R634 Abnormal weight loss: Secondary | ICD-10-CM | POA: Diagnosis not present

## 2017-08-29 DIAGNOSIS — R5383 Other fatigue: Secondary | ICD-10-CM | POA: Diagnosis not present

## 2017-08-29 DIAGNOSIS — R197 Diarrhea, unspecified: Secondary | ICD-10-CM | POA: Diagnosis not present

## 2017-08-29 DIAGNOSIS — E2749 Other adrenocortical insufficiency: Secondary | ICD-10-CM | POA: Diagnosis not present

## 2017-09-08 DIAGNOSIS — G629 Polyneuropathy, unspecified: Secondary | ICD-10-CM | POA: Diagnosis not present

## 2017-09-08 DIAGNOSIS — G894 Chronic pain syndrome: Secondary | ICD-10-CM | POA: Diagnosis not present

## 2017-09-08 DIAGNOSIS — M5137 Other intervertebral disc degeneration, lumbosacral region: Secondary | ICD-10-CM | POA: Diagnosis not present

## 2017-09-08 DIAGNOSIS — M47816 Spondylosis without myelopathy or radiculopathy, lumbar region: Secondary | ICD-10-CM | POA: Diagnosis not present

## 2017-09-11 DIAGNOSIS — N390 Urinary tract infection, site not specified: Secondary | ICD-10-CM | POA: Diagnosis not present

## 2017-09-25 DIAGNOSIS — E2740 Unspecified adrenocortical insufficiency: Secondary | ICD-10-CM | POA: Diagnosis not present

## 2017-09-25 DIAGNOSIS — R399 Unspecified symptoms and signs involving the genitourinary system: Secondary | ICD-10-CM | POA: Diagnosis not present

## 2017-09-25 DIAGNOSIS — G43109 Migraine with aura, not intractable, without status migrainosus: Secondary | ICD-10-CM | POA: Diagnosis not present

## 2017-09-25 DIAGNOSIS — J302 Other seasonal allergic rhinitis: Secondary | ICD-10-CM | POA: Diagnosis not present

## 2017-10-03 DIAGNOSIS — E2749 Other adrenocortical insufficiency: Secondary | ICD-10-CM | POA: Diagnosis not present

## 2017-10-03 DIAGNOSIS — M47816 Spondylosis without myelopathy or radiculopathy, lumbar region: Secondary | ICD-10-CM | POA: Diagnosis not present

## 2017-10-03 DIAGNOSIS — E039 Hypothyroidism, unspecified: Secondary | ICD-10-CM | POA: Diagnosis not present

## 2017-10-03 DIAGNOSIS — G894 Chronic pain syndrome: Secondary | ICD-10-CM | POA: Diagnosis not present

## 2017-10-03 DIAGNOSIS — M79606 Pain in leg, unspecified: Secondary | ICD-10-CM | POA: Diagnosis not present

## 2017-10-03 DIAGNOSIS — Z79891 Long term (current) use of opiate analgesic: Secondary | ICD-10-CM | POA: Diagnosis not present

## 2017-10-03 DIAGNOSIS — Z79899 Other long term (current) drug therapy: Secondary | ICD-10-CM | POA: Diagnosis not present

## 2017-10-03 DIAGNOSIS — M5137 Other intervertebral disc degeneration, lumbosacral region: Secondary | ICD-10-CM | POA: Diagnosis not present

## 2017-10-21 DIAGNOSIS — N390 Urinary tract infection, site not specified: Secondary | ICD-10-CM | POA: Diagnosis not present

## 2017-10-21 DIAGNOSIS — N200 Calculus of kidney: Secondary | ICD-10-CM | POA: Diagnosis not present

## 2017-10-21 DIAGNOSIS — I10 Essential (primary) hypertension: Secondary | ICD-10-CM | POA: Diagnosis not present

## 2017-10-21 DIAGNOSIS — N132 Hydronephrosis with renal and ureteral calculous obstruction: Secondary | ICD-10-CM | POA: Diagnosis not present

## 2017-10-24 DIAGNOSIS — J9811 Atelectasis: Secondary | ICD-10-CM | POA: Diagnosis not present

## 2017-10-24 DIAGNOSIS — R0902 Hypoxemia: Secondary | ICD-10-CM | POA: Diagnosis not present

## 2017-10-24 DIAGNOSIS — R4182 Altered mental status, unspecified: Secondary | ICD-10-CM | POA: Diagnosis not present

## 2017-10-24 DIAGNOSIS — N179 Acute kidney failure, unspecified: Secondary | ICD-10-CM | POA: Diagnosis not present

## 2017-10-24 DIAGNOSIS — J189 Pneumonia, unspecified organism: Secondary | ICD-10-CM | POA: Diagnosis not present

## 2017-10-24 DIAGNOSIS — N200 Calculus of kidney: Secondary | ICD-10-CM | POA: Diagnosis not present

## 2017-10-24 DIAGNOSIS — J9601 Acute respiratory failure with hypoxia: Secondary | ICD-10-CM | POA: Diagnosis not present

## 2017-10-24 DIAGNOSIS — N132 Hydronephrosis with renal and ureteral calculous obstruction: Secondary | ICD-10-CM | POA: Diagnosis not present

## 2017-10-24 DIAGNOSIS — R0602 Shortness of breath: Secondary | ICD-10-CM | POA: Diagnosis not present

## 2017-10-25 DIAGNOSIS — G8929 Other chronic pain: Secondary | ICD-10-CM | POA: Diagnosis not present

## 2017-10-25 DIAGNOSIS — N200 Calculus of kidney: Secondary | ICD-10-CM | POA: Diagnosis not present

## 2017-10-25 DIAGNOSIS — R0602 Shortness of breath: Secondary | ICD-10-CM | POA: Diagnosis not present

## 2017-10-25 DIAGNOSIS — K219 Gastro-esophageal reflux disease without esophagitis: Secondary | ICD-10-CM | POA: Diagnosis not present

## 2017-10-25 DIAGNOSIS — R7989 Other specified abnormal findings of blood chemistry: Secondary | ICD-10-CM | POA: Diagnosis not present

## 2017-10-25 DIAGNOSIS — N132 Hydronephrosis with renal and ureteral calculous obstruction: Secondary | ICD-10-CM | POA: Diagnosis not present

## 2017-10-25 DIAGNOSIS — J189 Pneumonia, unspecified organism: Secondary | ICD-10-CM | POA: Diagnosis not present

## 2017-10-25 DIAGNOSIS — G92 Toxic encephalopathy: Secondary | ICD-10-CM | POA: Diagnosis not present

## 2017-10-25 DIAGNOSIS — N289 Disorder of kidney and ureter, unspecified: Secondary | ICD-10-CM | POA: Diagnosis not present

## 2017-10-25 DIAGNOSIS — J9601 Acute respiratory failure with hypoxia: Secondary | ICD-10-CM | POA: Diagnosis not present

## 2017-10-25 DIAGNOSIS — E785 Hyperlipidemia, unspecified: Secondary | ICD-10-CM | POA: Diagnosis not present

## 2017-10-25 DIAGNOSIS — R400 Somnolence: Secondary | ICD-10-CM | POA: Diagnosis not present

## 2017-10-25 DIAGNOSIS — N201 Calculus of ureter: Secondary | ICD-10-CM | POA: Diagnosis not present

## 2017-10-25 DIAGNOSIS — N179 Acute kidney failure, unspecified: Secondary | ICD-10-CM | POA: Diagnosis not present

## 2017-10-25 DIAGNOSIS — R918 Other nonspecific abnormal finding of lung field: Secondary | ICD-10-CM | POA: Diagnosis not present

## 2017-10-25 DIAGNOSIS — J9811 Atelectasis: Secondary | ICD-10-CM | POA: Diagnosis not present

## 2017-10-25 DIAGNOSIS — E039 Hypothyroidism, unspecified: Secondary | ICD-10-CM | POA: Diagnosis not present

## 2017-10-25 DIAGNOSIS — R0902 Hypoxemia: Secondary | ICD-10-CM | POA: Diagnosis not present

## 2017-10-25 DIAGNOSIS — R4182 Altered mental status, unspecified: Secondary | ICD-10-CM | POA: Diagnosis not present

## 2017-10-25 DIAGNOSIS — T50905A Adverse effect of unspecified drugs, medicaments and biological substances, initial encounter: Secondary | ICD-10-CM | POA: Diagnosis not present

## 2017-10-25 DIAGNOSIS — F418 Other specified anxiety disorders: Secondary | ICD-10-CM | POA: Diagnosis not present

## 2017-10-25 DIAGNOSIS — R7881 Bacteremia: Secondary | ICD-10-CM | POA: Diagnosis not present

## 2017-10-25 DIAGNOSIS — J181 Lobar pneumonia, unspecified organism: Secondary | ICD-10-CM | POA: Diagnosis not present

## 2017-10-25 DIAGNOSIS — I10 Essential (primary) hypertension: Secondary | ICD-10-CM | POA: Diagnosis not present

## 2017-10-25 DIAGNOSIS — G43909 Migraine, unspecified, not intractable, without status migrainosus: Secondary | ICD-10-CM | POA: Diagnosis not present

## 2017-10-26 DIAGNOSIS — N201 Calculus of ureter: Secondary | ICD-10-CM | POA: Diagnosis not present

## 2017-10-26 DIAGNOSIS — N179 Acute kidney failure, unspecified: Secondary | ICD-10-CM | POA: Diagnosis not present

## 2017-10-26 DIAGNOSIS — J189 Pneumonia, unspecified organism: Secondary | ICD-10-CM | POA: Diagnosis not present

## 2017-10-26 DIAGNOSIS — N132 Hydronephrosis with renal and ureteral calculous obstruction: Secondary | ICD-10-CM | POA: Diagnosis not present

## 2017-10-26 DIAGNOSIS — N289 Disorder of kidney and ureter, unspecified: Secondary | ICD-10-CM | POA: Diagnosis not present

## 2017-10-26 DIAGNOSIS — R4182 Altered mental status, unspecified: Secondary | ICD-10-CM | POA: Diagnosis not present

## 2017-10-26 DIAGNOSIS — J9601 Acute respiratory failure with hypoxia: Secondary | ICD-10-CM | POA: Diagnosis not present

## 2017-10-27 DIAGNOSIS — N289 Disorder of kidney and ureter, unspecified: Secondary | ICD-10-CM | POA: Diagnosis not present

## 2017-10-27 DIAGNOSIS — J9601 Acute respiratory failure with hypoxia: Secondary | ICD-10-CM | POA: Diagnosis not present

## 2017-10-27 DIAGNOSIS — N132 Hydronephrosis with renal and ureteral calculous obstruction: Secondary | ICD-10-CM | POA: Diagnosis not present

## 2017-10-27 DIAGNOSIS — R4182 Altered mental status, unspecified: Secondary | ICD-10-CM | POA: Diagnosis not present

## 2017-10-27 DIAGNOSIS — N201 Calculus of ureter: Secondary | ICD-10-CM | POA: Diagnosis not present

## 2017-10-27 DIAGNOSIS — J189 Pneumonia, unspecified organism: Secondary | ICD-10-CM | POA: Diagnosis not present

## 2017-10-27 DIAGNOSIS — N179 Acute kidney failure, unspecified: Secondary | ICD-10-CM | POA: Diagnosis not present

## 2017-10-28 DIAGNOSIS — J9601 Acute respiratory failure with hypoxia: Secondary | ICD-10-CM | POA: Diagnosis not present

## 2017-10-28 DIAGNOSIS — R918 Other nonspecific abnormal finding of lung field: Secondary | ICD-10-CM | POA: Diagnosis not present

## 2017-10-28 DIAGNOSIS — N201 Calculus of ureter: Secondary | ICD-10-CM | POA: Diagnosis not present

## 2017-10-28 DIAGNOSIS — N179 Acute kidney failure, unspecified: Secondary | ICD-10-CM | POA: Diagnosis not present

## 2017-10-28 DIAGNOSIS — J189 Pneumonia, unspecified organism: Secondary | ICD-10-CM | POA: Diagnosis not present

## 2017-10-28 DIAGNOSIS — R4182 Altered mental status, unspecified: Secondary | ICD-10-CM | POA: Diagnosis not present

## 2017-10-29 DIAGNOSIS — R4182 Altered mental status, unspecified: Secondary | ICD-10-CM | POA: Diagnosis not present

## 2017-10-29 DIAGNOSIS — N179 Acute kidney failure, unspecified: Secondary | ICD-10-CM | POA: Diagnosis not present

## 2017-10-29 DIAGNOSIS — J189 Pneumonia, unspecified organism: Secondary | ICD-10-CM | POA: Diagnosis not present

## 2017-10-29 DIAGNOSIS — J9601 Acute respiratory failure with hypoxia: Secondary | ICD-10-CM | POA: Diagnosis not present

## 2017-10-30 DIAGNOSIS — N179 Acute kidney failure, unspecified: Secondary | ICD-10-CM | POA: Diagnosis not present

## 2017-10-30 DIAGNOSIS — J9601 Acute respiratory failure with hypoxia: Secondary | ICD-10-CM | POA: Diagnosis not present

## 2017-10-30 DIAGNOSIS — J189 Pneumonia, unspecified organism: Secondary | ICD-10-CM | POA: Diagnosis not present

## 2017-10-30 DIAGNOSIS — R4182 Altered mental status, unspecified: Secondary | ICD-10-CM | POA: Diagnosis not present

## 2017-10-31 DIAGNOSIS — N132 Hydronephrosis with renal and ureteral calculous obstruction: Secondary | ICD-10-CM | POA: Diagnosis not present

## 2017-10-31 DIAGNOSIS — N200 Calculus of kidney: Secondary | ICD-10-CM | POA: Diagnosis not present

## 2017-11-03 DIAGNOSIS — N200 Calculus of kidney: Secondary | ICD-10-CM | POA: Diagnosis not present

## 2017-11-03 DIAGNOSIS — N289 Disorder of kidney and ureter, unspecified: Secondary | ICD-10-CM | POA: Diagnosis not present

## 2017-11-03 DIAGNOSIS — Z8701 Personal history of pneumonia (recurrent): Secondary | ICD-10-CM | POA: Diagnosis not present

## 2017-11-03 DIAGNOSIS — B373 Candidiasis of vulva and vagina: Secondary | ICD-10-CM | POA: Diagnosis not present

## 2017-11-07 DIAGNOSIS — N200 Calculus of kidney: Secondary | ICD-10-CM | POA: Diagnosis not present

## 2017-11-07 DIAGNOSIS — N132 Hydronephrosis with renal and ureteral calculous obstruction: Secondary | ICD-10-CM | POA: Diagnosis not present

## 2017-11-17 DIAGNOSIS — Z79891 Long term (current) use of opiate analgesic: Secondary | ICD-10-CM | POA: Diagnosis not present

## 2017-11-17 DIAGNOSIS — G629 Polyneuropathy, unspecified: Secondary | ICD-10-CM | POA: Diagnosis not present

## 2017-11-17 DIAGNOSIS — M79606 Pain in leg, unspecified: Secondary | ICD-10-CM | POA: Diagnosis not present

## 2017-11-17 DIAGNOSIS — Z79899 Other long term (current) drug therapy: Secondary | ICD-10-CM | POA: Diagnosis not present

## 2017-11-17 DIAGNOSIS — M47816 Spondylosis without myelopathy or radiculopathy, lumbar region: Secondary | ICD-10-CM | POA: Diagnosis not present

## 2017-11-17 DIAGNOSIS — G894 Chronic pain syndrome: Secondary | ICD-10-CM | POA: Diagnosis not present

## 2017-12-19 DIAGNOSIS — M47816 Spondylosis without myelopathy or radiculopathy, lumbar region: Secondary | ICD-10-CM | POA: Diagnosis not present

## 2017-12-19 DIAGNOSIS — G894 Chronic pain syndrome: Secondary | ICD-10-CM | POA: Diagnosis not present

## 2017-12-19 DIAGNOSIS — M79606 Pain in leg, unspecified: Secondary | ICD-10-CM | POA: Diagnosis not present

## 2018-01-16 DIAGNOSIS — M47816 Spondylosis without myelopathy or radiculopathy, lumbar region: Secondary | ICD-10-CM | POA: Diagnosis not present

## 2018-01-16 DIAGNOSIS — Z79891 Long term (current) use of opiate analgesic: Secondary | ICD-10-CM | POA: Diagnosis not present

## 2018-01-16 DIAGNOSIS — M79606 Pain in leg, unspecified: Secondary | ICD-10-CM | POA: Diagnosis not present

## 2018-01-16 DIAGNOSIS — Z79899 Other long term (current) drug therapy: Secondary | ICD-10-CM | POA: Diagnosis not present

## 2018-01-16 DIAGNOSIS — G894 Chronic pain syndrome: Secondary | ICD-10-CM | POA: Diagnosis not present

## 2018-01-25 DIAGNOSIS — G43109 Migraine with aura, not intractable, without status migrainosus: Secondary | ICD-10-CM | POA: Diagnosis not present

## 2018-01-25 DIAGNOSIS — F419 Anxiety disorder, unspecified: Secondary | ICD-10-CM | POA: Diagnosis not present

## 2018-01-25 DIAGNOSIS — M722 Plantar fascial fibromatosis: Secondary | ICD-10-CM | POA: Diagnosis not present

## 2018-01-25 DIAGNOSIS — E039 Hypothyroidism, unspecified: Secondary | ICD-10-CM | POA: Diagnosis not present

## 2018-02-13 DIAGNOSIS — G629 Polyneuropathy, unspecified: Secondary | ICD-10-CM | POA: Diagnosis not present

## 2018-02-13 DIAGNOSIS — M79606 Pain in leg, unspecified: Secondary | ICD-10-CM | POA: Diagnosis not present

## 2018-02-13 DIAGNOSIS — M47816 Spondylosis without myelopathy or radiculopathy, lumbar region: Secondary | ICD-10-CM | POA: Diagnosis not present

## 2018-02-13 DIAGNOSIS — G894 Chronic pain syndrome: Secondary | ICD-10-CM | POA: Diagnosis not present

## 2018-03-05 DIAGNOSIS — J069 Acute upper respiratory infection, unspecified: Secondary | ICD-10-CM | POA: Diagnosis not present

## 2018-03-13 DIAGNOSIS — Z79891 Long term (current) use of opiate analgesic: Secondary | ICD-10-CM | POA: Diagnosis not present

## 2018-03-13 DIAGNOSIS — M79606 Pain in leg, unspecified: Secondary | ICD-10-CM | POA: Diagnosis not present

## 2018-03-13 DIAGNOSIS — M47816 Spondylosis without myelopathy or radiculopathy, lumbar region: Secondary | ICD-10-CM | POA: Diagnosis not present

## 2018-03-13 DIAGNOSIS — G629 Polyneuropathy, unspecified: Secondary | ICD-10-CM | POA: Diagnosis not present

## 2018-03-13 DIAGNOSIS — G894 Chronic pain syndrome: Secondary | ICD-10-CM | POA: Diagnosis not present

## 2018-03-13 DIAGNOSIS — Z79899 Other long term (current) drug therapy: Secondary | ICD-10-CM | POA: Diagnosis not present

## 2018-03-29 DIAGNOSIS — E669 Obesity, unspecified: Secondary | ICD-10-CM | POA: Diagnosis not present

## 2018-03-29 DIAGNOSIS — H6591 Unspecified nonsuppurative otitis media, right ear: Secondary | ICD-10-CM | POA: Diagnosis not present

## 2018-03-29 DIAGNOSIS — Z6833 Body mass index (BMI) 33.0-33.9, adult: Secondary | ICD-10-CM | POA: Diagnosis not present

## 2018-03-29 DIAGNOSIS — I10 Essential (primary) hypertension: Secondary | ICD-10-CM | POA: Diagnosis not present

## 2018-04-05 DIAGNOSIS — Z79899 Other long term (current) drug therapy: Secondary | ICD-10-CM | POA: Diagnosis not present

## 2018-04-05 DIAGNOSIS — G894 Chronic pain syndrome: Secondary | ICD-10-CM | POA: Diagnosis not present

## 2018-04-05 DIAGNOSIS — Z79891 Long term (current) use of opiate analgesic: Secondary | ICD-10-CM | POA: Diagnosis not present

## 2018-04-06 DIAGNOSIS — E039 Hypothyroidism, unspecified: Secondary | ICD-10-CM | POA: Diagnosis not present

## 2018-04-06 DIAGNOSIS — E2749 Other adrenocortical insufficiency: Secondary | ICD-10-CM | POA: Diagnosis not present

## 2018-04-13 DIAGNOSIS — M79606 Pain in leg, unspecified: Secondary | ICD-10-CM | POA: Diagnosis not present

## 2018-04-13 DIAGNOSIS — G894 Chronic pain syndrome: Secondary | ICD-10-CM | POA: Diagnosis not present

## 2018-04-13 DIAGNOSIS — Z79899 Other long term (current) drug therapy: Secondary | ICD-10-CM | POA: Diagnosis not present

## 2018-04-13 DIAGNOSIS — M47816 Spondylosis without myelopathy or radiculopathy, lumbar region: Secondary | ICD-10-CM | POA: Diagnosis not present

## 2018-04-13 DIAGNOSIS — G629 Polyneuropathy, unspecified: Secondary | ICD-10-CM | POA: Diagnosis not present

## 2018-04-13 DIAGNOSIS — Z79891 Long term (current) use of opiate analgesic: Secondary | ICD-10-CM | POA: Diagnosis not present

## 2018-05-21 DIAGNOSIS — M47816 Spondylosis without myelopathy or radiculopathy, lumbar region: Secondary | ICD-10-CM | POA: Diagnosis not present

## 2018-05-21 DIAGNOSIS — M79606 Pain in leg, unspecified: Secondary | ICD-10-CM | POA: Diagnosis not present

## 2018-05-21 DIAGNOSIS — Z79891 Long term (current) use of opiate analgesic: Secondary | ICD-10-CM | POA: Diagnosis not present

## 2018-05-21 DIAGNOSIS — G894 Chronic pain syndrome: Secondary | ICD-10-CM | POA: Diagnosis not present

## 2018-05-21 DIAGNOSIS — Z79899 Other long term (current) drug therapy: Secondary | ICD-10-CM | POA: Diagnosis not present

## 2018-05-21 DIAGNOSIS — G629 Polyneuropathy, unspecified: Secondary | ICD-10-CM | POA: Diagnosis not present

## 2018-06-25 DIAGNOSIS — G629 Polyneuropathy, unspecified: Secondary | ICD-10-CM | POA: Diagnosis not present

## 2018-06-25 DIAGNOSIS — Z79891 Long term (current) use of opiate analgesic: Secondary | ICD-10-CM | POA: Diagnosis not present

## 2018-06-25 DIAGNOSIS — M79606 Pain in leg, unspecified: Secondary | ICD-10-CM | POA: Diagnosis not present

## 2018-06-25 DIAGNOSIS — G894 Chronic pain syndrome: Secondary | ICD-10-CM | POA: Diagnosis not present

## 2018-06-25 DIAGNOSIS — Z79899 Other long term (current) drug therapy: Secondary | ICD-10-CM | POA: Diagnosis not present

## 2018-06-25 DIAGNOSIS — M47816 Spondylosis without myelopathy or radiculopathy, lumbar region: Secondary | ICD-10-CM | POA: Diagnosis not present

## 2018-07-02 DIAGNOSIS — R05 Cough: Secondary | ICD-10-CM | POA: Diagnosis not present

## 2018-07-02 DIAGNOSIS — R509 Fever, unspecified: Secondary | ICD-10-CM | POA: Diagnosis not present

## 2018-07-02 DIAGNOSIS — R062 Wheezing: Secondary | ICD-10-CM | POA: Diagnosis not present

## 2018-07-02 DIAGNOSIS — Z6835 Body mass index (BMI) 35.0-35.9, adult: Secondary | ICD-10-CM | POA: Diagnosis not present

## 2018-07-26 DIAGNOSIS — M47816 Spondylosis without myelopathy or radiculopathy, lumbar region: Secondary | ICD-10-CM | POA: Diagnosis not present

## 2018-07-26 DIAGNOSIS — M5137 Other intervertebral disc degeneration, lumbosacral region: Secondary | ICD-10-CM | POA: Diagnosis not present

## 2018-07-26 DIAGNOSIS — M7060 Trochanteric bursitis, unspecified hip: Secondary | ICD-10-CM | POA: Diagnosis not present

## 2018-07-26 DIAGNOSIS — G894 Chronic pain syndrome: Secondary | ICD-10-CM | POA: Diagnosis not present

## 2018-08-02 ENCOUNTER — Other Ambulatory Visit: Payer: Self-pay | Admitting: Physician Assistant

## 2018-08-02 ENCOUNTER — Ambulatory Visit
Admission: RE | Admit: 2018-08-02 | Discharge: 2018-08-02 | Disposition: A | Discharge status: Home / Self Care | Payer: Medicare Other | Source: Ambulatory Visit | Attending: Physician Assistant | Admitting: Physician Assistant

## 2018-08-02 DIAGNOSIS — M48061 Spinal stenosis, lumbar region without neurogenic claudication: Secondary | ICD-10-CM | POA: Diagnosis not present

## 2018-08-02 DIAGNOSIS — M545 Low back pain, unspecified: Secondary | ICD-10-CM

## 2018-08-23 DIAGNOSIS — M79606 Pain in leg, unspecified: Secondary | ICD-10-CM | POA: Diagnosis not present

## 2018-08-23 DIAGNOSIS — M7060 Trochanteric bursitis, unspecified hip: Secondary | ICD-10-CM | POA: Diagnosis not present

## 2018-08-23 DIAGNOSIS — G894 Chronic pain syndrome: Secondary | ICD-10-CM | POA: Diagnosis not present

## 2018-08-23 DIAGNOSIS — M47816 Spondylosis without myelopathy or radiculopathy, lumbar region: Secondary | ICD-10-CM | POA: Diagnosis not present

## 2018-09-20 DIAGNOSIS — G894 Chronic pain syndrome: Secondary | ICD-10-CM | POA: Diagnosis not present

## 2018-09-20 DIAGNOSIS — M7060 Trochanteric bursitis, unspecified hip: Secondary | ICD-10-CM | POA: Diagnosis not present

## 2018-09-20 DIAGNOSIS — M5137 Other intervertebral disc degeneration, lumbosacral region: Secondary | ICD-10-CM | POA: Diagnosis not present

## 2018-09-20 DIAGNOSIS — Z79891 Long term (current) use of opiate analgesic: Secondary | ICD-10-CM | POA: Diagnosis not present

## 2018-09-20 DIAGNOSIS — M47816 Spondylosis without myelopathy or radiculopathy, lumbar region: Secondary | ICD-10-CM | POA: Diagnosis not present

## 2018-09-20 DIAGNOSIS — Z79899 Other long term (current) drug therapy: Secondary | ICD-10-CM | POA: Diagnosis not present

## 2018-10-11 DIAGNOSIS — E039 Hypothyroidism, unspecified: Secondary | ICD-10-CM | POA: Diagnosis not present

## 2018-10-11 DIAGNOSIS — E2749 Other adrenocortical insufficiency: Secondary | ICD-10-CM | POA: Diagnosis not present

## 2018-10-17 DIAGNOSIS — E2749 Other adrenocortical insufficiency: Secondary | ICD-10-CM | POA: Diagnosis not present

## 2018-10-17 DIAGNOSIS — E039 Hypothyroidism, unspecified: Secondary | ICD-10-CM | POA: Diagnosis not present

## 2018-10-31 DIAGNOSIS — R109 Unspecified abdominal pain: Secondary | ICD-10-CM | POA: Diagnosis not present

## 2018-10-31 DIAGNOSIS — Z6837 Body mass index (BMI) 37.0-37.9, adult: Secondary | ICD-10-CM | POA: Diagnosis not present

## 2018-11-20 DIAGNOSIS — Z79891 Long term (current) use of opiate analgesic: Secondary | ICD-10-CM | POA: Diagnosis not present

## 2018-11-20 DIAGNOSIS — Z79899 Other long term (current) drug therapy: Secondary | ICD-10-CM | POA: Diagnosis not present

## 2018-11-20 DIAGNOSIS — M47816 Spondylosis without myelopathy or radiculopathy, lumbar region: Secondary | ICD-10-CM | POA: Diagnosis not present

## 2018-11-20 DIAGNOSIS — M7060 Trochanteric bursitis, unspecified hip: Secondary | ICD-10-CM | POA: Diagnosis not present

## 2018-11-20 DIAGNOSIS — G629 Polyneuropathy, unspecified: Secondary | ICD-10-CM | POA: Diagnosis not present

## 2018-11-20 DIAGNOSIS — G894 Chronic pain syndrome: Secondary | ICD-10-CM | POA: Diagnosis not present

## 2018-12-03 DIAGNOSIS — G894 Chronic pain syndrome: Secondary | ICD-10-CM | POA: Diagnosis not present

## 2018-12-03 DIAGNOSIS — Z9689 Presence of other specified functional implants: Secondary | ICD-10-CM | POA: Diagnosis not present

## 2018-12-03 DIAGNOSIS — Z4542 Encounter for adjustment and management of neuropacemaker (brain) (peripheral nerve) (spinal cord): Secondary | ICD-10-CM | POA: Diagnosis not present

## 2018-12-03 DIAGNOSIS — M961 Postlaminectomy syndrome, not elsewhere classified: Secondary | ICD-10-CM | POA: Diagnosis not present

## 2019-01-01 DIAGNOSIS — G894 Chronic pain syndrome: Secondary | ICD-10-CM | POA: Diagnosis not present

## 2019-01-01 DIAGNOSIS — G629 Polyneuropathy, unspecified: Secondary | ICD-10-CM | POA: Diagnosis not present

## 2019-01-01 DIAGNOSIS — M7060 Trochanteric bursitis, unspecified hip: Secondary | ICD-10-CM | POA: Diagnosis not present

## 2019-01-01 DIAGNOSIS — M47816 Spondylosis without myelopathy or radiculopathy, lumbar region: Secondary | ICD-10-CM | POA: Diagnosis not present

## 2019-01-28 DIAGNOSIS — M961 Postlaminectomy syndrome, not elsewhere classified: Secondary | ICD-10-CM | POA: Diagnosis not present

## 2019-01-28 DIAGNOSIS — Z9689 Presence of other specified functional implants: Secondary | ICD-10-CM | POA: Diagnosis not present

## 2019-01-28 DIAGNOSIS — G894 Chronic pain syndrome: Secondary | ICD-10-CM | POA: Diagnosis not present

## 2019-01-28 DIAGNOSIS — M7061 Trochanteric bursitis, right hip: Secondary | ICD-10-CM | POA: Diagnosis not present

## 2019-01-31 DIAGNOSIS — E039 Hypothyroidism, unspecified: Secondary | ICD-10-CM | POA: Diagnosis not present

## 2019-01-31 DIAGNOSIS — Z79899 Other long term (current) drug therapy: Secondary | ICD-10-CM | POA: Diagnosis not present

## 2019-01-31 DIAGNOSIS — R5383 Other fatigue: Secondary | ICD-10-CM | POA: Diagnosis not present

## 2019-01-31 DIAGNOSIS — E785 Hyperlipidemia, unspecified: Secondary | ICD-10-CM | POA: Diagnosis not present

## 2019-01-31 DIAGNOSIS — G43109 Migraine with aura, not intractable, without status migrainosus: Secondary | ICD-10-CM | POA: Diagnosis not present

## 2019-02-19 DIAGNOSIS — R233 Spontaneous ecchymoses: Secondary | ICD-10-CM | POA: Diagnosis not present

## 2019-02-19 DIAGNOSIS — Z6834 Body mass index (BMI) 34.0-34.9, adult: Secondary | ICD-10-CM | POA: Diagnosis not present

## 2019-02-26 DIAGNOSIS — M47816 Spondylosis without myelopathy or radiculopathy, lumbar region: Secondary | ICD-10-CM | POA: Diagnosis not present

## 2019-02-26 DIAGNOSIS — Z79899 Other long term (current) drug therapy: Secondary | ICD-10-CM | POA: Diagnosis not present

## 2019-02-26 DIAGNOSIS — Z79891 Long term (current) use of opiate analgesic: Secondary | ICD-10-CM | POA: Diagnosis not present

## 2019-02-26 DIAGNOSIS — M79606 Pain in leg, unspecified: Secondary | ICD-10-CM | POA: Diagnosis not present

## 2019-02-26 DIAGNOSIS — M7060 Trochanteric bursitis, unspecified hip: Secondary | ICD-10-CM | POA: Diagnosis not present

## 2019-02-26 DIAGNOSIS — G894 Chronic pain syndrome: Secondary | ICD-10-CM | POA: Diagnosis not present

## 2019-04-01 DIAGNOSIS — G894 Chronic pain syndrome: Secondary | ICD-10-CM | POA: Diagnosis not present

## 2019-04-01 DIAGNOSIS — M47816 Spondylosis without myelopathy or radiculopathy, lumbar region: Secondary | ICD-10-CM | POA: Diagnosis not present

## 2019-04-01 DIAGNOSIS — G629 Polyneuropathy, unspecified: Secondary | ICD-10-CM | POA: Diagnosis not present

## 2019-04-01 DIAGNOSIS — M5137 Other intervertebral disc degeneration, lumbosacral region: Secondary | ICD-10-CM | POA: Diagnosis not present

## 2019-04-04 ENCOUNTER — Ambulatory Visit: Payer: Medicare Other | Admitting: Neurology

## 2019-05-06 ENCOUNTER — Other Ambulatory Visit: Payer: Self-pay

## 2019-05-06 ENCOUNTER — Other Ambulatory Visit: Payer: Self-pay | Admitting: Physician Assistant

## 2019-05-06 ENCOUNTER — Ambulatory Visit
Admission: RE | Admit: 2019-05-06 | Discharge: 2019-05-06 | Disposition: A | Discharge status: Home / Self Care | Payer: Medicare Other | Source: Ambulatory Visit | Attending: Physician Assistant | Admitting: Physician Assistant

## 2019-05-06 DIAGNOSIS — M25561 Pain in right knee: Secondary | ICD-10-CM | POA: Diagnosis not present

## 2019-05-06 DIAGNOSIS — G894 Chronic pain syndrome: Secondary | ICD-10-CM | POA: Diagnosis not present

## 2019-05-06 DIAGNOSIS — M25569 Pain in unspecified knee: Secondary | ICD-10-CM | POA: Diagnosis not present

## 2019-05-06 DIAGNOSIS — M5137 Other intervertebral disc degeneration, lumbosacral region: Secondary | ICD-10-CM | POA: Diagnosis not present

## 2019-05-06 DIAGNOSIS — M47816 Spondylosis without myelopathy or radiculopathy, lumbar region: Secondary | ICD-10-CM | POA: Diagnosis not present

## 2019-05-15 DIAGNOSIS — E039 Hypothyroidism, unspecified: Secondary | ICD-10-CM | POA: Diagnosis not present

## 2019-05-15 DIAGNOSIS — K219 Gastro-esophageal reflux disease without esophagitis: Secondary | ICD-10-CM | POA: Diagnosis not present

## 2019-05-15 DIAGNOSIS — Z79899 Other long term (current) drug therapy: Secondary | ICD-10-CM | POA: Diagnosis not present

## 2019-05-15 DIAGNOSIS — E785 Hyperlipidemia, unspecified: Secondary | ICD-10-CM | POA: Diagnosis not present

## 2019-05-15 DIAGNOSIS — I10 Essential (primary) hypertension: Secondary | ICD-10-CM | POA: Diagnosis not present

## 2019-06-03 DIAGNOSIS — Z79891 Long term (current) use of opiate analgesic: Secondary | ICD-10-CM | POA: Diagnosis not present

## 2019-06-03 DIAGNOSIS — Z79899 Other long term (current) drug therapy: Secondary | ICD-10-CM | POA: Diagnosis not present

## 2019-06-03 DIAGNOSIS — G894 Chronic pain syndrome: Secondary | ICD-10-CM | POA: Diagnosis not present

## 2019-06-03 DIAGNOSIS — M47816 Spondylosis without myelopathy or radiculopathy, lumbar region: Secondary | ICD-10-CM | POA: Diagnosis not present

## 2019-06-03 DIAGNOSIS — M5137 Other intervertebral disc degeneration, lumbosacral region: Secondary | ICD-10-CM | POA: Diagnosis not present

## 2019-06-03 DIAGNOSIS — M25569 Pain in unspecified knee: Secondary | ICD-10-CM | POA: Diagnosis not present

## 2019-07-04 DIAGNOSIS — R5381 Other malaise: Secondary | ICD-10-CM | POA: Diagnosis not present

## 2019-07-04 DIAGNOSIS — R519 Headache, unspecified: Secondary | ICD-10-CM | POA: Diagnosis not present

## 2019-07-04 DIAGNOSIS — Z20822 Contact with and (suspected) exposure to covid-19: Secondary | ICD-10-CM | POA: Diagnosis not present

## 2019-07-15 DIAGNOSIS — E039 Hypothyroidism, unspecified: Secondary | ICD-10-CM | POA: Diagnosis not present

## 2019-07-19 DIAGNOSIS — Z20828 Contact with and (suspected) exposure to other viral communicable diseases: Secondary | ICD-10-CM | POA: Diagnosis not present

## 2019-07-23 DIAGNOSIS — R0981 Nasal congestion: Secondary | ICD-10-CM | POA: Diagnosis not present

## 2019-07-23 DIAGNOSIS — Z20828 Contact with and (suspected) exposure to other viral communicable diseases: Secondary | ICD-10-CM | POA: Diagnosis not present

## 2019-08-09 DIAGNOSIS — M5137 Other intervertebral disc degeneration, lumbosacral region: Secondary | ICD-10-CM | POA: Diagnosis not present

## 2019-08-09 DIAGNOSIS — M25569 Pain in unspecified knee: Secondary | ICD-10-CM | POA: Diagnosis not present

## 2019-08-09 DIAGNOSIS — G894 Chronic pain syndrome: Secondary | ICD-10-CM | POA: Diagnosis not present

## 2019-08-09 DIAGNOSIS — M47816 Spondylosis without myelopathy or radiculopathy, lumbar region: Secondary | ICD-10-CM | POA: Diagnosis not present

## 2019-09-06 DIAGNOSIS — Z79899 Other long term (current) drug therapy: Secondary | ICD-10-CM | POA: Diagnosis not present

## 2019-09-06 DIAGNOSIS — G894 Chronic pain syndrome: Secondary | ICD-10-CM | POA: Diagnosis not present

## 2019-09-06 DIAGNOSIS — M47816 Spondylosis without myelopathy or radiculopathy, lumbar region: Secondary | ICD-10-CM | POA: Diagnosis not present

## 2019-09-06 DIAGNOSIS — M5137 Other intervertebral disc degeneration, lumbosacral region: Secondary | ICD-10-CM | POA: Diagnosis not present

## 2019-09-06 DIAGNOSIS — Z79891 Long term (current) use of opiate analgesic: Secondary | ICD-10-CM | POA: Diagnosis not present

## 2019-09-06 DIAGNOSIS — M7060 Trochanteric bursitis, unspecified hip: Secondary | ICD-10-CM | POA: Diagnosis not present

## 2019-10-03 DIAGNOSIS — I10 Essential (primary) hypertension: Secondary | ICD-10-CM | POA: Diagnosis not present

## 2019-10-03 DIAGNOSIS — R5383 Other fatigue: Secondary | ICD-10-CM | POA: Diagnosis not present

## 2019-10-03 DIAGNOSIS — G2581 Restless legs syndrome: Secondary | ICD-10-CM | POA: Diagnosis not present

## 2019-10-03 DIAGNOSIS — Z79899 Other long term (current) drug therapy: Secondary | ICD-10-CM | POA: Diagnosis not present

## 2019-10-04 ENCOUNTER — Ambulatory Visit
Admission: RE | Admit: 2019-10-04 | Discharge: 2019-10-04 | Disposition: A | Discharge status: Home / Self Care | Payer: Medicare Other | Source: Ambulatory Visit | Attending: Physician Assistant | Admitting: Physician Assistant

## 2019-10-04 ENCOUNTER — Other Ambulatory Visit: Payer: Self-pay | Admitting: Physician Assistant

## 2019-10-04 DIAGNOSIS — M25511 Pain in right shoulder: Secondary | ICD-10-CM

## 2019-10-04 DIAGNOSIS — G894 Chronic pain syndrome: Secondary | ICD-10-CM | POA: Diagnosis not present

## 2019-10-04 DIAGNOSIS — M5137 Other intervertebral disc degeneration, lumbosacral region: Secondary | ICD-10-CM | POA: Diagnosis not present

## 2019-10-04 DIAGNOSIS — M47816 Spondylosis without myelopathy or radiculopathy, lumbar region: Secondary | ICD-10-CM | POA: Diagnosis not present

## 2019-10-04 DIAGNOSIS — G629 Polyneuropathy, unspecified: Secondary | ICD-10-CM | POA: Diagnosis not present

## 2019-10-04 DIAGNOSIS — M19011 Primary osteoarthritis, right shoulder: Secondary | ICD-10-CM | POA: Diagnosis not present

## 2019-10-23 DIAGNOSIS — Z1231 Encounter for screening mammogram for malignant neoplasm of breast: Secondary | ICD-10-CM | POA: Diagnosis not present

## 2019-11-01 DIAGNOSIS — G629 Polyneuropathy, unspecified: Secondary | ICD-10-CM | POA: Diagnosis not present

## 2019-11-01 DIAGNOSIS — M47816 Spondylosis without myelopathy or radiculopathy, lumbar region: Secondary | ICD-10-CM | POA: Diagnosis not present

## 2019-11-01 DIAGNOSIS — G894 Chronic pain syndrome: Secondary | ICD-10-CM | POA: Diagnosis not present

## 2019-11-01 DIAGNOSIS — M5137 Other intervertebral disc degeneration, lumbosacral region: Secondary | ICD-10-CM | POA: Diagnosis not present

## 2019-11-28 DIAGNOSIS — M19011 Primary osteoarthritis, right shoulder: Secondary | ICD-10-CM | POA: Diagnosis not present

## 2019-11-29 DIAGNOSIS — M5137 Other intervertebral disc degeneration, lumbosacral region: Secondary | ICD-10-CM | POA: Diagnosis not present

## 2019-11-29 DIAGNOSIS — Z79899 Other long term (current) drug therapy: Secondary | ICD-10-CM | POA: Diagnosis not present

## 2019-11-29 DIAGNOSIS — M47816 Spondylosis without myelopathy or radiculopathy, lumbar region: Secondary | ICD-10-CM | POA: Diagnosis not present

## 2019-11-29 DIAGNOSIS — Z79891 Long term (current) use of opiate analgesic: Secondary | ICD-10-CM | POA: Diagnosis not present

## 2019-11-29 DIAGNOSIS — G629 Polyneuropathy, unspecified: Secondary | ICD-10-CM | POA: Diagnosis not present

## 2019-11-29 DIAGNOSIS — G894 Chronic pain syndrome: Secondary | ICD-10-CM | POA: Diagnosis not present

## 2019-12-18 DIAGNOSIS — E039 Hypothyroidism, unspecified: Secondary | ICD-10-CM | POA: Diagnosis not present

## 2019-12-30 DIAGNOSIS — M545 Low back pain: Secondary | ICD-10-CM | POA: Diagnosis not present

## 2019-12-30 DIAGNOSIS — M7061 Trochanteric bursitis, right hip: Secondary | ICD-10-CM | POA: Diagnosis not present

## 2019-12-30 DIAGNOSIS — M961 Postlaminectomy syndrome, not elsewhere classified: Secondary | ICD-10-CM | POA: Diagnosis not present

## 2019-12-30 DIAGNOSIS — G894 Chronic pain syndrome: Secondary | ICD-10-CM | POA: Diagnosis not present

## 2020-01-29 DIAGNOSIS — G894 Chronic pain syndrome: Secondary | ICD-10-CM | POA: Diagnosis not present

## 2020-01-29 DIAGNOSIS — M47816 Spondylosis without myelopathy or radiculopathy, lumbar region: Secondary | ICD-10-CM | POA: Diagnosis not present

## 2020-01-29 DIAGNOSIS — M5137 Other intervertebral disc degeneration, lumbosacral region: Secondary | ICD-10-CM | POA: Diagnosis not present

## 2020-01-29 DIAGNOSIS — G629 Polyneuropathy, unspecified: Secondary | ICD-10-CM | POA: Diagnosis not present

## 2020-02-27 DIAGNOSIS — Z79899 Other long term (current) drug therapy: Secondary | ICD-10-CM | POA: Diagnosis not present

## 2020-02-27 DIAGNOSIS — M19011 Primary osteoarthritis, right shoulder: Secondary | ICD-10-CM | POA: Diagnosis not present

## 2020-02-27 DIAGNOSIS — Z79891 Long term (current) use of opiate analgesic: Secondary | ICD-10-CM | POA: Diagnosis not present

## 2020-02-27 DIAGNOSIS — G894 Chronic pain syndrome: Secondary | ICD-10-CM | POA: Diagnosis not present

## 2020-03-27 DIAGNOSIS — Z9689 Presence of other specified functional implants: Secondary | ICD-10-CM | POA: Diagnosis not present

## 2020-03-27 DIAGNOSIS — M545 Low back pain, unspecified: Secondary | ICD-10-CM | POA: Diagnosis not present

## 2020-03-27 DIAGNOSIS — M961 Postlaminectomy syndrome, not elsewhere classified: Secondary | ICD-10-CM | POA: Diagnosis not present

## 2020-03-27 DIAGNOSIS — G894 Chronic pain syndrome: Secondary | ICD-10-CM | POA: Diagnosis not present

## 2020-04-08 DIAGNOSIS — I10 Essential (primary) hypertension: Secondary | ICD-10-CM | POA: Diagnosis not present

## 2020-04-08 DIAGNOSIS — E039 Hypothyroidism, unspecified: Secondary | ICD-10-CM | POA: Diagnosis not present

## 2020-04-08 DIAGNOSIS — Z79899 Other long term (current) drug therapy: Secondary | ICD-10-CM | POA: Diagnosis not present

## 2020-04-08 DIAGNOSIS — E785 Hyperlipidemia, unspecified: Secondary | ICD-10-CM | POA: Diagnosis not present

## 2020-04-08 DIAGNOSIS — G43109 Migraine with aura, not intractable, without status migrainosus: Secondary | ICD-10-CM | POA: Diagnosis not present

## 2020-04-28 DIAGNOSIS — Z79891 Long term (current) use of opiate analgesic: Secondary | ICD-10-CM | POA: Diagnosis not present

## 2020-04-28 DIAGNOSIS — M5137 Other intervertebral disc degeneration, lumbosacral region: Secondary | ICD-10-CM | POA: Diagnosis not present

## 2020-04-28 DIAGNOSIS — G629 Polyneuropathy, unspecified: Secondary | ICD-10-CM | POA: Diagnosis not present

## 2020-04-28 DIAGNOSIS — Z79899 Other long term (current) drug therapy: Secondary | ICD-10-CM | POA: Diagnosis not present

## 2020-04-28 DIAGNOSIS — G894 Chronic pain syndrome: Secondary | ICD-10-CM | POA: Diagnosis not present

## 2020-04-28 DIAGNOSIS — M47816 Spondylosis without myelopathy or radiculopathy, lumbar region: Secondary | ICD-10-CM | POA: Diagnosis not present

## 2020-05-26 DIAGNOSIS — G894 Chronic pain syndrome: Secondary | ICD-10-CM | POA: Diagnosis not present

## 2020-05-26 DIAGNOSIS — G629 Polyneuropathy, unspecified: Secondary | ICD-10-CM | POA: Diagnosis not present

## 2020-05-26 DIAGNOSIS — M47816 Spondylosis without myelopathy or radiculopathy, lumbar region: Secondary | ICD-10-CM | POA: Diagnosis not present

## 2020-05-26 DIAGNOSIS — M5137 Other intervertebral disc degeneration, lumbosacral region: Secondary | ICD-10-CM | POA: Diagnosis not present

## 2020-06-10 ENCOUNTER — Ambulatory Visit
Admission: RE | Admit: 2020-06-10 | Discharge: 2020-06-10 | Disposition: A | Discharge status: Home / Self Care | Payer: Medicare Other | Source: Ambulatory Visit | Attending: Physician Assistant | Admitting: Physician Assistant

## 2020-06-10 ENCOUNTER — Other Ambulatory Visit: Payer: Self-pay

## 2020-06-10 ENCOUNTER — Other Ambulatory Visit: Payer: Self-pay | Admitting: Physician Assistant

## 2020-06-10 DIAGNOSIS — M79671 Pain in right foot: Secondary | ICD-10-CM | POA: Diagnosis not present

## 2020-06-26 DIAGNOSIS — M25511 Pain in right shoulder: Secondary | ICD-10-CM | POA: Diagnosis not present

## 2020-06-26 DIAGNOSIS — S46011A Strain of muscle(s) and tendon(s) of the rotator cuff of right shoulder, initial encounter: Secondary | ICD-10-CM | POA: Diagnosis not present

## 2020-06-30 DIAGNOSIS — M47816 Spondylosis without myelopathy or radiculopathy, lumbar region: Secondary | ICD-10-CM | POA: Diagnosis not present

## 2020-06-30 DIAGNOSIS — M5137 Other intervertebral disc degeneration, lumbosacral region: Secondary | ICD-10-CM | POA: Diagnosis not present

## 2020-06-30 DIAGNOSIS — Z79891 Long term (current) use of opiate analgesic: Secondary | ICD-10-CM | POA: Diagnosis not present

## 2020-06-30 DIAGNOSIS — G894 Chronic pain syndrome: Secondary | ICD-10-CM | POA: Diagnosis not present

## 2020-06-30 DIAGNOSIS — Z79899 Other long term (current) drug therapy: Secondary | ICD-10-CM | POA: Diagnosis not present

## 2020-06-30 DIAGNOSIS — M25569 Pain in unspecified knee: Secondary | ICD-10-CM | POA: Diagnosis not present

## 2020-07-28 DIAGNOSIS — G629 Polyneuropathy, unspecified: Secondary | ICD-10-CM | POA: Diagnosis not present

## 2020-07-28 DIAGNOSIS — M5137 Other intervertebral disc degeneration, lumbosacral region: Secondary | ICD-10-CM | POA: Diagnosis not present

## 2020-07-28 DIAGNOSIS — M47816 Spondylosis without myelopathy or radiculopathy, lumbar region: Secondary | ICD-10-CM | POA: Diagnosis not present

## 2020-07-28 DIAGNOSIS — G894 Chronic pain syndrome: Secondary | ICD-10-CM | POA: Diagnosis not present

## 2020-08-08 DIAGNOSIS — Z01812 Encounter for preprocedural laboratory examination: Secondary | ICD-10-CM | POA: Diagnosis not present

## 2020-08-08 DIAGNOSIS — Z20822 Contact with and (suspected) exposure to covid-19: Secondary | ICD-10-CM | POA: Diagnosis not present

## 2020-08-11 DIAGNOSIS — E119 Type 2 diabetes mellitus without complications: Secondary | ICD-10-CM | POA: Diagnosis not present

## 2020-08-11 DIAGNOSIS — Z981 Arthrodesis status: Secondary | ICD-10-CM | POA: Diagnosis not present

## 2020-08-11 DIAGNOSIS — G43909 Migraine, unspecified, not intractable, without status migrainosus: Secondary | ICD-10-CM | POA: Diagnosis not present

## 2020-08-11 DIAGNOSIS — T85193A Other mechanical complication of implanted electronic neurostimulator, generator, initial encounter: Secondary | ICD-10-CM | POA: Diagnosis not present

## 2020-08-11 DIAGNOSIS — M961 Postlaminectomy syndrome, not elsewhere classified: Secondary | ICD-10-CM | POA: Diagnosis not present

## 2020-08-11 DIAGNOSIS — Z6836 Body mass index (BMI) 36.0-36.9, adult: Secondary | ICD-10-CM | POA: Diagnosis not present

## 2020-08-11 DIAGNOSIS — T85113A Breakdown (mechanical) of implanted electronic neurostimulator, generator, initial encounter: Secondary | ICD-10-CM | POA: Diagnosis not present

## 2020-08-11 DIAGNOSIS — I10 Essential (primary) hypertension: Secondary | ICD-10-CM | POA: Diagnosis not present

## 2020-08-11 DIAGNOSIS — Z79899 Other long term (current) drug therapy: Secondary | ICD-10-CM | POA: Diagnosis not present

## 2020-08-11 DIAGNOSIS — K219 Gastro-esophageal reflux disease without esophagitis: Secondary | ICD-10-CM | POA: Diagnosis not present

## 2020-08-11 DIAGNOSIS — G894 Chronic pain syndrome: Secondary | ICD-10-CM | POA: Diagnosis not present

## 2020-08-11 DIAGNOSIS — Z7984 Long term (current) use of oral hypoglycemic drugs: Secondary | ICD-10-CM | POA: Diagnosis not present

## 2020-08-11 DIAGNOSIS — Z4542 Encounter for adjustment and management of neuropacemaker (brain) (peripheral nerve) (spinal cord): Secondary | ICD-10-CM | POA: Diagnosis not present

## 2020-08-11 DIAGNOSIS — E669 Obesity, unspecified: Secondary | ICD-10-CM | POA: Diagnosis not present

## 2020-08-11 DIAGNOSIS — F32A Depression, unspecified: Secondary | ICD-10-CM | POA: Diagnosis not present

## 2020-08-11 DIAGNOSIS — Z791 Long term (current) use of non-steroidal anti-inflammatories (NSAID): Secondary | ICD-10-CM | POA: Diagnosis not present

## 2020-08-11 DIAGNOSIS — E039 Hypothyroidism, unspecified: Secondary | ICD-10-CM | POA: Diagnosis not present

## 2020-08-11 DIAGNOSIS — E785 Hyperlipidemia, unspecified: Secondary | ICD-10-CM | POA: Diagnosis not present

## 2020-08-26 DIAGNOSIS — M545 Low back pain, unspecified: Secondary | ICD-10-CM | POA: Diagnosis not present

## 2020-08-26 DIAGNOSIS — Z9689 Presence of other specified functional implants: Secondary | ICD-10-CM | POA: Diagnosis not present

## 2020-08-26 DIAGNOSIS — G894 Chronic pain syndrome: Secondary | ICD-10-CM | POA: Diagnosis not present

## 2020-08-26 DIAGNOSIS — M961 Postlaminectomy syndrome, not elsewhere classified: Secondary | ICD-10-CM | POA: Diagnosis not present

## 2020-09-21 DIAGNOSIS — Z79891 Long term (current) use of opiate analgesic: Secondary | ICD-10-CM | POA: Diagnosis not present

## 2020-09-21 DIAGNOSIS — M961 Postlaminectomy syndrome, not elsewhere classified: Secondary | ICD-10-CM | POA: Diagnosis not present

## 2020-09-21 DIAGNOSIS — M5459 Other low back pain: Secondary | ICD-10-CM | POA: Diagnosis not present

## 2020-09-21 DIAGNOSIS — Z79899 Other long term (current) drug therapy: Secondary | ICD-10-CM | POA: Diagnosis not present

## 2020-09-21 DIAGNOSIS — M25519 Pain in unspecified shoulder: Secondary | ICD-10-CM | POA: Diagnosis not present

## 2020-09-21 DIAGNOSIS — G894 Chronic pain syndrome: Secondary | ICD-10-CM | POA: Diagnosis not present

## 2020-09-25 ENCOUNTER — Other Ambulatory Visit: Payer: Self-pay | Admitting: Physician Assistant

## 2020-09-25 DIAGNOSIS — M75100 Unspecified rotator cuff tear or rupture of unspecified shoulder, not specified as traumatic: Secondary | ICD-10-CM

## 2020-10-05 DIAGNOSIS — R059 Cough, unspecified: Secondary | ICD-10-CM | POA: Diagnosis not present

## 2020-10-05 DIAGNOSIS — J302 Other seasonal allergic rhinitis: Secondary | ICD-10-CM | POA: Diagnosis not present

## 2020-10-05 DIAGNOSIS — R062 Wheezing: Secondary | ICD-10-CM | POA: Diagnosis not present

## 2020-10-30 ENCOUNTER — Ambulatory Visit
Admission: RE | Admit: 2020-10-30 | Discharge: 2020-10-30 | Disposition: A | Discharge status: Home / Self Care | Payer: Medicare Other | Source: Ambulatory Visit | Attending: Physician Assistant | Admitting: Physician Assistant

## 2020-10-30 ENCOUNTER — Other Ambulatory Visit: Payer: Self-pay

## 2020-10-30 DIAGNOSIS — M25511 Pain in right shoulder: Secondary | ICD-10-CM | POA: Diagnosis not present

## 2020-10-30 DIAGNOSIS — M75100 Unspecified rotator cuff tear or rupture of unspecified shoulder, not specified as traumatic: Secondary | ICD-10-CM

## 2020-11-02 DIAGNOSIS — G894 Chronic pain syndrome: Secondary | ICD-10-CM | POA: Diagnosis not present

## 2020-11-02 DIAGNOSIS — Z9689 Presence of other specified functional implants: Secondary | ICD-10-CM | POA: Diagnosis not present

## 2020-11-02 DIAGNOSIS — M961 Postlaminectomy syndrome, not elsewhere classified: Secondary | ICD-10-CM | POA: Diagnosis not present

## 2020-11-02 DIAGNOSIS — M25519 Pain in unspecified shoulder: Secondary | ICD-10-CM | POA: Diagnosis not present

## 2020-12-01 DIAGNOSIS — Z9689 Presence of other specified functional implants: Secondary | ICD-10-CM | POA: Diagnosis not present

## 2020-12-01 DIAGNOSIS — M961 Postlaminectomy syndrome, not elsewhere classified: Secondary | ICD-10-CM | POA: Diagnosis not present

## 2020-12-01 DIAGNOSIS — M19011 Primary osteoarthritis, right shoulder: Secondary | ICD-10-CM | POA: Diagnosis not present

## 2020-12-01 DIAGNOSIS — Z79891 Long term (current) use of opiate analgesic: Secondary | ICD-10-CM | POA: Diagnosis not present

## 2020-12-01 DIAGNOSIS — M25519 Pain in unspecified shoulder: Secondary | ICD-10-CM | POA: Diagnosis not present

## 2020-12-01 DIAGNOSIS — G894 Chronic pain syndrome: Secondary | ICD-10-CM | POA: Diagnosis not present

## 2020-12-01 DIAGNOSIS — Z79899 Other long term (current) drug therapy: Secondary | ICD-10-CM | POA: Diagnosis not present

## 2020-12-29 DIAGNOSIS — M961 Postlaminectomy syndrome, not elsewhere classified: Secondary | ICD-10-CM | POA: Diagnosis not present

## 2020-12-29 DIAGNOSIS — G894 Chronic pain syndrome: Secondary | ICD-10-CM | POA: Diagnosis not present

## 2020-12-29 DIAGNOSIS — M25519 Pain in unspecified shoulder: Secondary | ICD-10-CM | POA: Diagnosis not present

## 2020-12-29 DIAGNOSIS — M5459 Other low back pain: Secondary | ICD-10-CM | POA: Diagnosis not present

## 2021-01-19 DIAGNOSIS — R7303 Prediabetes: Secondary | ICD-10-CM | POA: Diagnosis not present

## 2021-01-19 DIAGNOSIS — Z0000 Encounter for general adult medical examination without abnormal findings: Secondary | ICD-10-CM | POA: Diagnosis not present

## 2021-01-19 DIAGNOSIS — I10 Essential (primary) hypertension: Secondary | ICD-10-CM | POA: Diagnosis not present

## 2021-01-19 DIAGNOSIS — E785 Hyperlipidemia, unspecified: Secondary | ICD-10-CM | POA: Diagnosis not present

## 2021-01-19 DIAGNOSIS — Z1331 Encounter for screening for depression: Secondary | ICD-10-CM | POA: Diagnosis not present

## 2021-01-19 DIAGNOSIS — F331 Major depressive disorder, recurrent, moderate: Secondary | ICD-10-CM | POA: Diagnosis not present

## 2021-01-19 DIAGNOSIS — E039 Hypothyroidism, unspecified: Secondary | ICD-10-CM | POA: Diagnosis not present

## 2021-01-26 DIAGNOSIS — Z1231 Encounter for screening mammogram for malignant neoplasm of breast: Secondary | ICD-10-CM | POA: Diagnosis not present

## 2021-01-29 DIAGNOSIS — M25519 Pain in unspecified shoulder: Secondary | ICD-10-CM | POA: Diagnosis not present

## 2021-01-29 DIAGNOSIS — M5459 Other low back pain: Secondary | ICD-10-CM | POA: Diagnosis not present

## 2021-01-29 DIAGNOSIS — M961 Postlaminectomy syndrome, not elsewhere classified: Secondary | ICD-10-CM | POA: Diagnosis not present

## 2021-01-29 DIAGNOSIS — G894 Chronic pain syndrome: Secondary | ICD-10-CM | POA: Diagnosis not present

## 2021-03-15 DIAGNOSIS — I10 Essential (primary) hypertension: Secondary | ICD-10-CM | POA: Diagnosis not present

## 2021-03-15 DIAGNOSIS — F419 Anxiety disorder, unspecified: Secondary | ICD-10-CM | POA: Diagnosis not present

## 2021-03-15 DIAGNOSIS — E039 Hypothyroidism, unspecified: Secondary | ICD-10-CM | POA: Diagnosis not present

## 2021-03-15 DIAGNOSIS — G4700 Insomnia, unspecified: Secondary | ICD-10-CM | POA: Diagnosis not present

## 2021-03-19 DIAGNOSIS — M25519 Pain in unspecified shoulder: Secondary | ICD-10-CM | POA: Diagnosis not present

## 2021-03-19 DIAGNOSIS — M5459 Other low back pain: Secondary | ICD-10-CM | POA: Diagnosis not present

## 2021-03-19 DIAGNOSIS — G894 Chronic pain syndrome: Secondary | ICD-10-CM | POA: Diagnosis not present

## 2021-03-19 DIAGNOSIS — M961 Postlaminectomy syndrome, not elsewhere classified: Secondary | ICD-10-CM | POA: Diagnosis not present

## 2021-04-16 DIAGNOSIS — M961 Postlaminectomy syndrome, not elsewhere classified: Secondary | ICD-10-CM | POA: Diagnosis not present

## 2021-04-16 DIAGNOSIS — M25519 Pain in unspecified shoulder: Secondary | ICD-10-CM | POA: Diagnosis not present

## 2021-04-16 DIAGNOSIS — G894 Chronic pain syndrome: Secondary | ICD-10-CM | POA: Diagnosis not present

## 2021-04-16 DIAGNOSIS — M5459 Other low back pain: Secondary | ICD-10-CM | POA: Diagnosis not present

## 2021-05-18 DIAGNOSIS — Z79899 Other long term (current) drug therapy: Secondary | ICD-10-CM | POA: Diagnosis not present

## 2021-05-18 DIAGNOSIS — Z9689 Presence of other specified functional implants: Secondary | ICD-10-CM | POA: Diagnosis not present

## 2021-05-18 DIAGNOSIS — Z79891 Long term (current) use of opiate analgesic: Secondary | ICD-10-CM | POA: Diagnosis not present

## 2021-05-18 DIAGNOSIS — M961 Postlaminectomy syndrome, not elsewhere classified: Secondary | ICD-10-CM | POA: Diagnosis not present

## 2021-05-18 DIAGNOSIS — M25519 Pain in unspecified shoulder: Secondary | ICD-10-CM | POA: Diagnosis not present

## 2021-05-18 DIAGNOSIS — G894 Chronic pain syndrome: Secondary | ICD-10-CM | POA: Diagnosis not present

## 2021-06-11 DIAGNOSIS — R1030 Lower abdominal pain, unspecified: Secondary | ICD-10-CM | POA: Diagnosis not present

## 2021-06-11 DIAGNOSIS — J020 Streptococcal pharyngitis: Secondary | ICD-10-CM | POA: Diagnosis not present

## 2021-06-11 DIAGNOSIS — Z20822 Contact with and (suspected) exposure to covid-19: Secondary | ICD-10-CM | POA: Diagnosis not present

## 2021-06-17 DIAGNOSIS — M961 Postlaminectomy syndrome, not elsewhere classified: Secondary | ICD-10-CM | POA: Diagnosis not present

## 2021-06-17 DIAGNOSIS — G894 Chronic pain syndrome: Secondary | ICD-10-CM | POA: Diagnosis not present

## 2021-06-17 DIAGNOSIS — M25519 Pain in unspecified shoulder: Secondary | ICD-10-CM | POA: Diagnosis not present

## 2021-07-29 DIAGNOSIS — M47816 Spondylosis without myelopathy or radiculopathy, lumbar region: Secondary | ICD-10-CM | POA: Diagnosis not present

## 2021-07-29 DIAGNOSIS — M5137 Other intervertebral disc degeneration, lumbosacral region: Secondary | ICD-10-CM | POA: Diagnosis not present

## 2021-07-29 DIAGNOSIS — Z79899 Other long term (current) drug therapy: Secondary | ICD-10-CM | POA: Diagnosis not present

## 2021-07-29 DIAGNOSIS — G894 Chronic pain syndrome: Secondary | ICD-10-CM | POA: Diagnosis not present

## 2021-07-29 DIAGNOSIS — Z79891 Long term (current) use of opiate analgesic: Secondary | ICD-10-CM | POA: Diagnosis not present

## 2021-07-29 DIAGNOSIS — M25569 Pain in unspecified knee: Secondary | ICD-10-CM | POA: Diagnosis not present

## 2021-08-19 DIAGNOSIS — R7301 Impaired fasting glucose: Secondary | ICD-10-CM | POA: Diagnosis not present

## 2021-08-19 DIAGNOSIS — Z79899 Other long term (current) drug therapy: Secondary | ICD-10-CM | POA: Diagnosis not present

## 2021-08-19 DIAGNOSIS — I10 Essential (primary) hypertension: Secondary | ICD-10-CM | POA: Diagnosis not present

## 2021-08-19 DIAGNOSIS — E785 Hyperlipidemia, unspecified: Secondary | ICD-10-CM | POA: Diagnosis not present

## 2021-08-19 DIAGNOSIS — E039 Hypothyroidism, unspecified: Secondary | ICD-10-CM | POA: Diagnosis not present

## 2021-09-03 DIAGNOSIS — M545 Low back pain, unspecified: Secondary | ICD-10-CM | POA: Diagnosis not present

## 2021-09-03 DIAGNOSIS — Z79891 Long term (current) use of opiate analgesic: Secondary | ICD-10-CM | POA: Diagnosis not present

## 2021-10-01 DIAGNOSIS — M545 Low back pain, unspecified: Secondary | ICD-10-CM | POA: Diagnosis not present

## 2021-10-01 DIAGNOSIS — Z79891 Long term (current) use of opiate analgesic: Secondary | ICD-10-CM | POA: Diagnosis not present

## 2021-10-01 DIAGNOSIS — M7062 Trochanteric bursitis, left hip: Secondary | ICD-10-CM | POA: Diagnosis not present

## 2021-10-08 DIAGNOSIS — E781 Pure hyperglyceridemia: Secondary | ICD-10-CM | POA: Diagnosis not present

## 2021-10-08 DIAGNOSIS — E039 Hypothyroidism, unspecified: Secondary | ICD-10-CM | POA: Diagnosis not present

## 2021-10-18 DIAGNOSIS — M7062 Trochanteric bursitis, left hip: Secondary | ICD-10-CM | POA: Diagnosis not present

## 2021-10-29 DIAGNOSIS — M961 Postlaminectomy syndrome, not elsewhere classified: Secondary | ICD-10-CM | POA: Diagnosis not present

## 2021-10-29 DIAGNOSIS — M47814 Spondylosis without myelopathy or radiculopathy, thoracic region: Secondary | ICD-10-CM | POA: Diagnosis not present

## 2021-10-29 DIAGNOSIS — Z79891 Long term (current) use of opiate analgesic: Secondary | ICD-10-CM | POA: Diagnosis not present

## 2021-11-11 DIAGNOSIS — M5451 Vertebrogenic low back pain: Secondary | ICD-10-CM | POA: Diagnosis not present

## 2021-11-11 DIAGNOSIS — M25559 Pain in unspecified hip: Secondary | ICD-10-CM | POA: Diagnosis not present

## 2021-11-11 DIAGNOSIS — M546 Pain in thoracic spine: Secondary | ICD-10-CM | POA: Diagnosis not present

## 2021-11-23 DIAGNOSIS — M47816 Spondylosis without myelopathy or radiculopathy, lumbar region: Secondary | ICD-10-CM | POA: Diagnosis not present

## 2021-12-09 DIAGNOSIS — M25559 Pain in unspecified hip: Secondary | ICD-10-CM | POA: Diagnosis not present

## 2021-12-09 DIAGNOSIS — M546 Pain in thoracic spine: Secondary | ICD-10-CM | POA: Diagnosis not present

## 2021-12-09 DIAGNOSIS — M5451 Vertebrogenic low back pain: Secondary | ICD-10-CM | POA: Diagnosis not present

## 2021-12-22 DIAGNOSIS — M47816 Spondylosis without myelopathy or radiculopathy, lumbar region: Secondary | ICD-10-CM | POA: Diagnosis not present

## 2022-01-05 DIAGNOSIS — M47817 Spondylosis without myelopathy or radiculopathy, lumbosacral region: Secondary | ICD-10-CM | POA: Diagnosis not present

## 2022-01-05 DIAGNOSIS — M961 Postlaminectomy syndrome, not elsewhere classified: Secondary | ICD-10-CM | POA: Diagnosis not present

## 2022-01-05 DIAGNOSIS — M545 Low back pain, unspecified: Secondary | ICD-10-CM | POA: Diagnosis not present

## 2022-01-05 DIAGNOSIS — Z79891 Long term (current) use of opiate analgesic: Secondary | ICD-10-CM | POA: Diagnosis not present

## 2022-01-09 IMAGING — MR MR SHOULDER*R* W/O CM
4 of 5 series · 30 of 40 positions shown · non-contrast
Comparison: Right shoulder x-rays dated October 04, 2019.

CLINICAL DATA: Chronic right shoulder pain for the past 8 months.
No injury or prior surgery.

EXAM:
MRI OF THE RIGHT SHOULDER WITHOUT CONTRAST
TECHNIQUE: Multiplanar, multisequence MR imaging of the shoulder was performed.
No intravenous contrast was administered.

[Series 4: T2 fat-sat · oblique · 4.0mm · 0.55mm/px · 7 of 20 slices shown (1 of 3)]
[im 1/20]
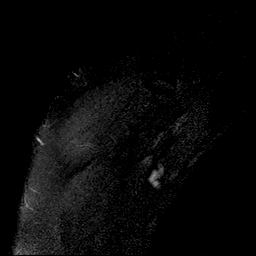
[im 4/20]
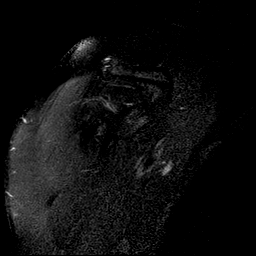
[im 7/20]
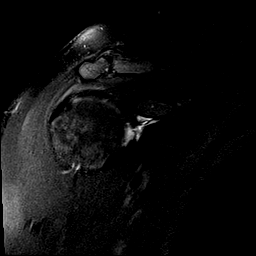
[im 10/20]
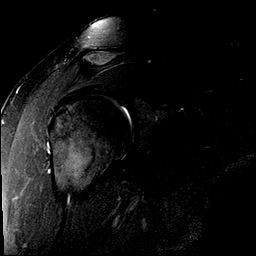
[im 13/20]
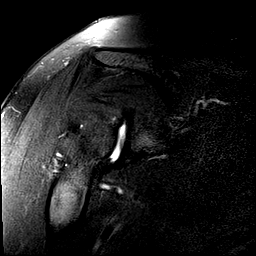
[im 16/20]
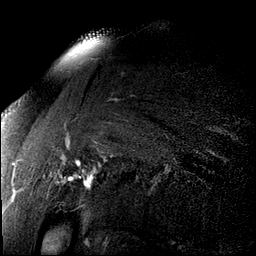
[im 20/20]
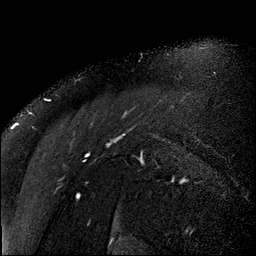

[Series 6: PD · oblique · 4.0mm · 0.27mm/px · 7 of 20 slices shown]
[im 1/20]
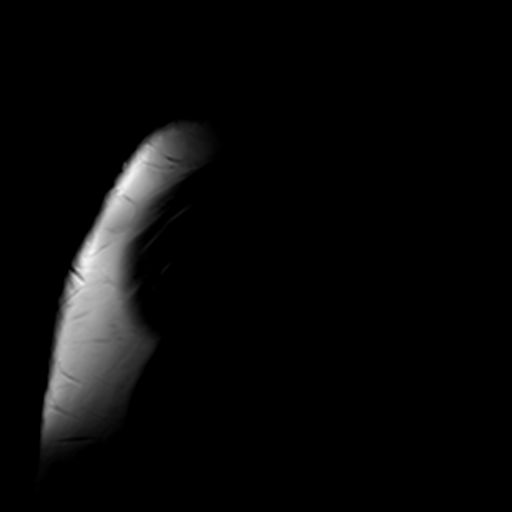
[im 4/20]
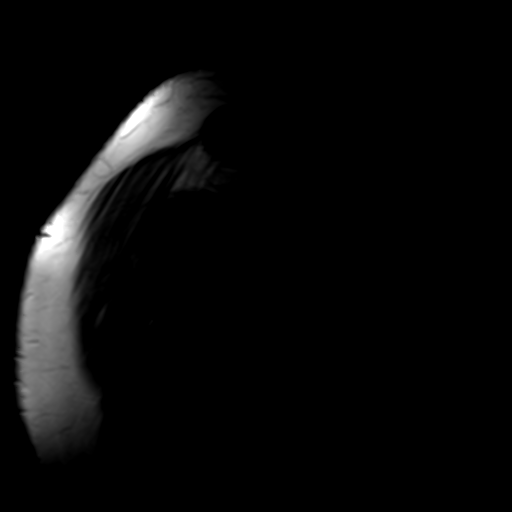
[im 7/20]
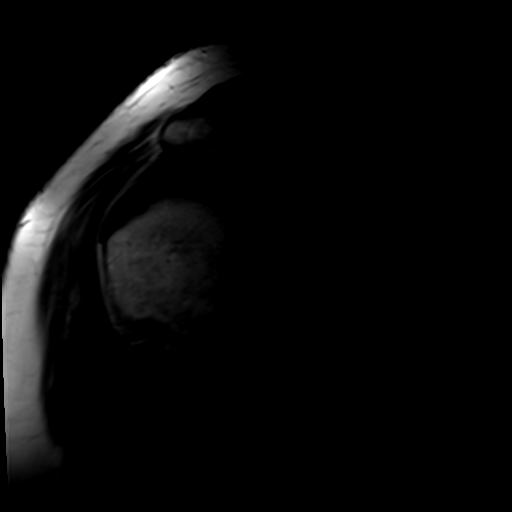
[im 10/20]
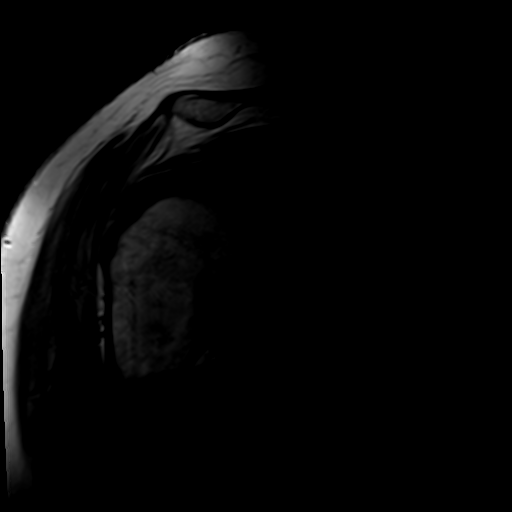
[im 13/20]
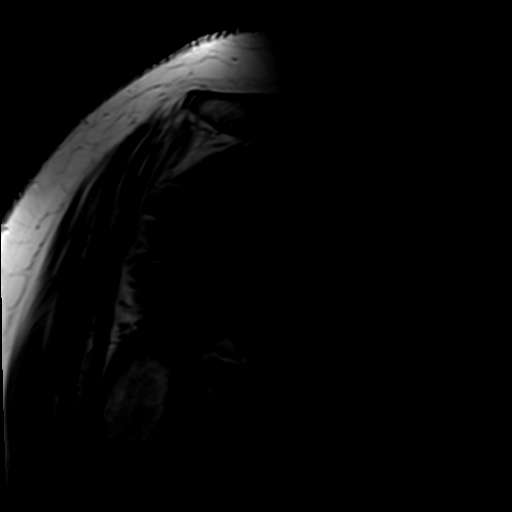
[im 16/20]
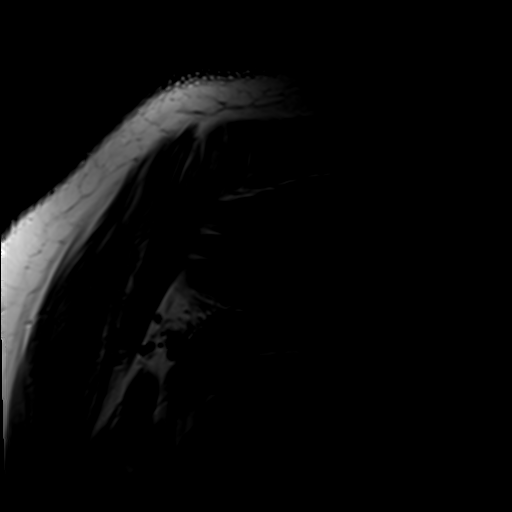
[im 20/20]
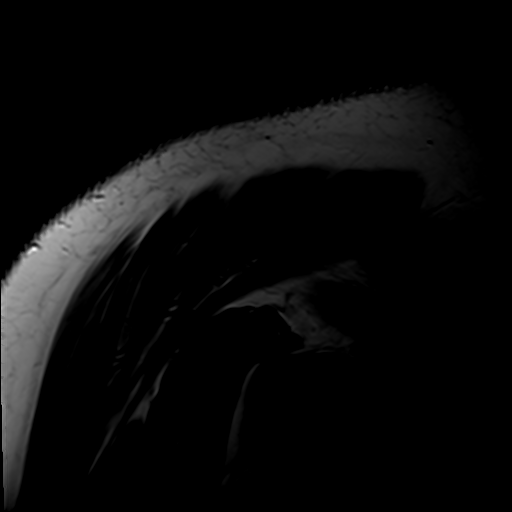

[Series 7: T2 fat-sat · axial · 4.0mm · 0.27mm/px · z∈[-49,+90]mm · 8 of 30 slices shown (2 of 3)]
[im 1/30]
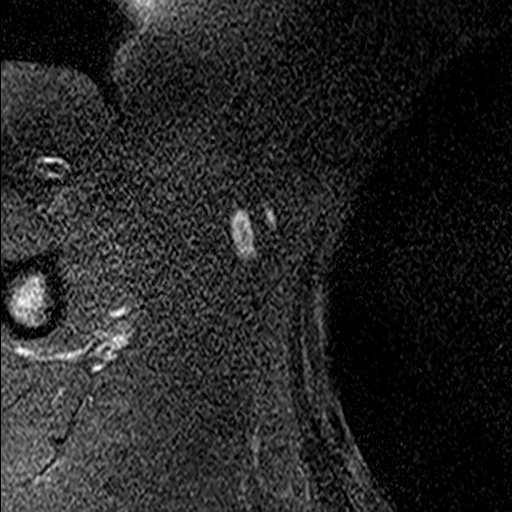
[im 4/30]
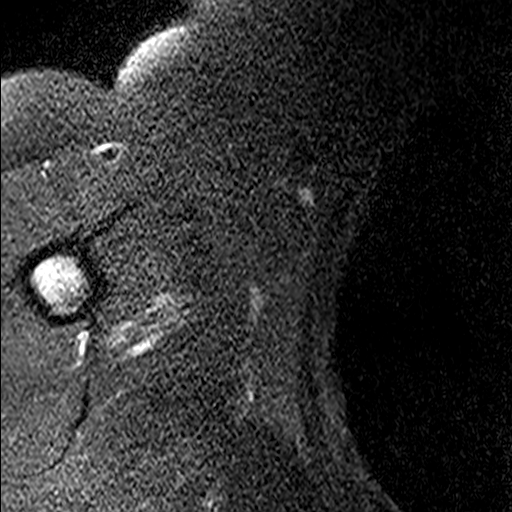
[im 10/30]
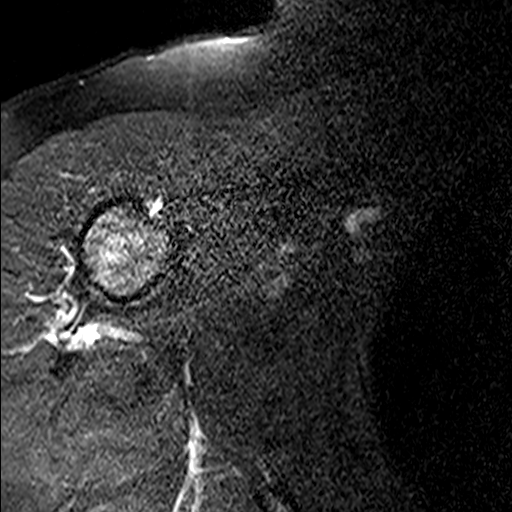
[im 13/30]
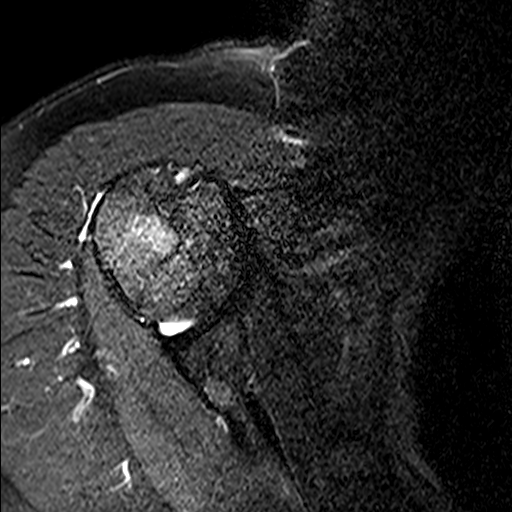
[im 17/30]
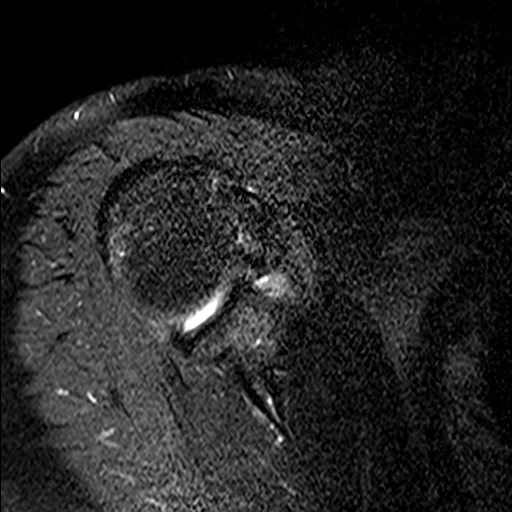
[im 20/30]
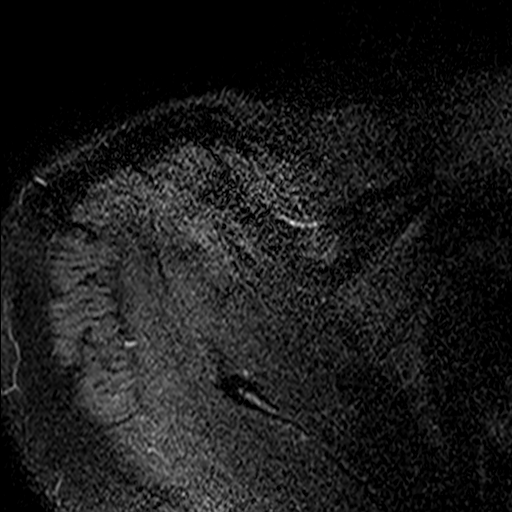
[im 26/30]
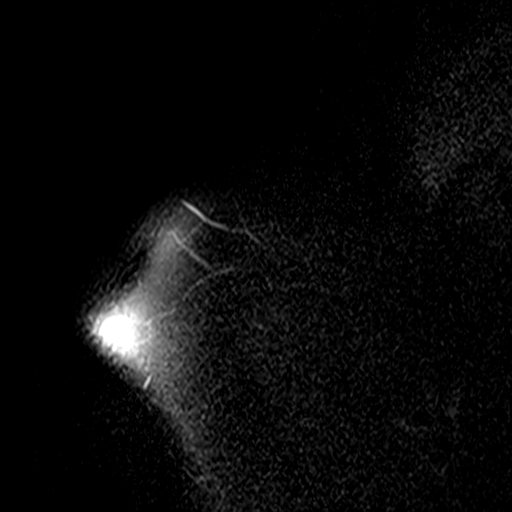
[im 30/30]
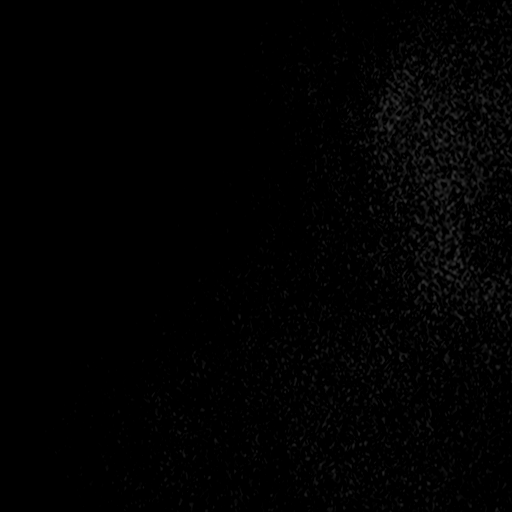

[Series 8: T2 fat-sat · oblique · 4.0mm · 0.55mm/px · 8 of 25 slices shown (3 of 3)]
[im 1/25]
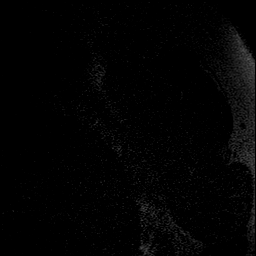
[im 4/25]
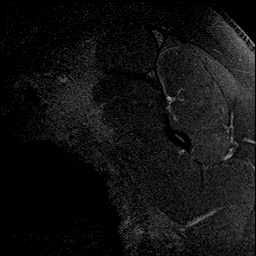
[im 7/25]
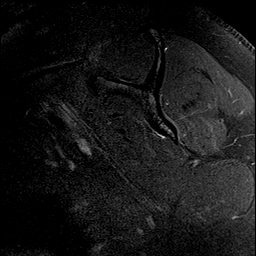
[im 11/25]
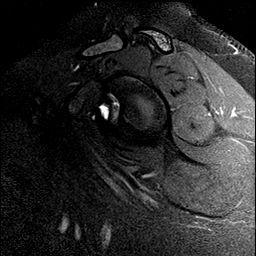
[im 14/25]
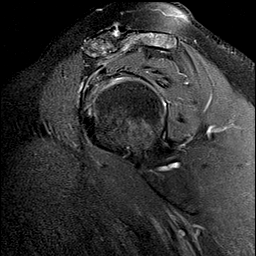
[im 18/25]
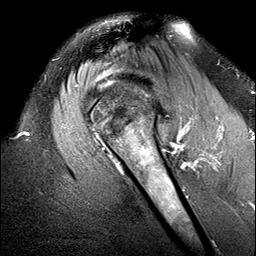
[im 21/25]
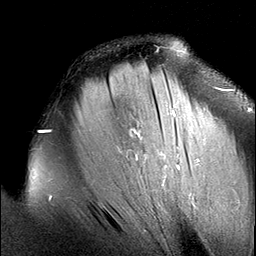
[im 25/25]
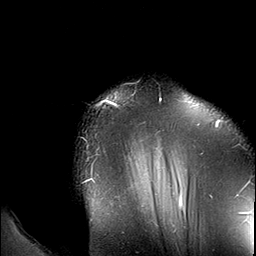

[30 of 40 positions shown; findings below may reference images not displayed]

FINDINGS: Rotator cuff:  Intact rotator cuff.  Mild supraspinatus tendinosis.

Muscles: No atrophy or abnormal signal of the muscles of the rotator
cuff.

Biceps long head:  Intact and normally positioned.

Acromioclavicular Joint: Mild arthropathy of the acromioclavicular
joint. Type I acromion. No subacromial/subdeltoid bursal fluid.

Glenohumeral Joint: No joint effusion. No chondral defect.

Labrum: Small superior labral tear. Remaining labrum is grossly
intact.

Bones: No acute fracture or dislocation. No suspicious bone lesion.

Other: None.
IMPRESSION: 1. Mild supraspinatus tendinosis. No rotator cuff tear.
2. Small superior labral tear.
3. Mild acromioclavicular osteoarthritis.

## 2022-02-02 DIAGNOSIS — M47816 Spondylosis without myelopathy or radiculopathy, lumbar region: Secondary | ICD-10-CM | POA: Diagnosis not present

## 2022-02-22 DIAGNOSIS — M47816 Spondylosis without myelopathy or radiculopathy, lumbar region: Secondary | ICD-10-CM | POA: Diagnosis not present

## 2022-02-22 DIAGNOSIS — M545 Low back pain, unspecified: Secondary | ICD-10-CM | POA: Diagnosis not present

## 2022-02-22 DIAGNOSIS — Z79891 Long term (current) use of opiate analgesic: Secondary | ICD-10-CM | POA: Diagnosis not present

## 2022-02-23 DIAGNOSIS — E785 Hyperlipidemia, unspecified: Secondary | ICD-10-CM | POA: Diagnosis not present

## 2022-02-23 DIAGNOSIS — I10 Essential (primary) hypertension: Secondary | ICD-10-CM | POA: Diagnosis not present

## 2022-02-23 DIAGNOSIS — E039 Hypothyroidism, unspecified: Secondary | ICD-10-CM | POA: Diagnosis not present

## 2022-02-23 DIAGNOSIS — G2581 Restless legs syndrome: Secondary | ICD-10-CM | POA: Diagnosis not present

## 2022-03-22 DIAGNOSIS — M4727 Other spondylosis with radiculopathy, lumbosacral region: Secondary | ICD-10-CM | POA: Diagnosis not present

## 2022-03-22 DIAGNOSIS — M545 Low back pain, unspecified: Secondary | ICD-10-CM | POA: Diagnosis not present

## 2022-03-22 DIAGNOSIS — Z79891 Long term (current) use of opiate analgesic: Secondary | ICD-10-CM | POA: Diagnosis not present

## 2022-04-15 DIAGNOSIS — M545 Low back pain, unspecified: Secondary | ICD-10-CM | POA: Diagnosis not present

## 2022-04-15 DIAGNOSIS — M4727 Other spondylosis with radiculopathy, lumbosacral region: Secondary | ICD-10-CM | POA: Diagnosis not present

## 2022-04-15 DIAGNOSIS — Z79891 Long term (current) use of opiate analgesic: Secondary | ICD-10-CM | POA: Diagnosis not present

## 2022-04-29 DIAGNOSIS — M545 Low back pain, unspecified: Secondary | ICD-10-CM | POA: Diagnosis not present

## 2022-04-29 DIAGNOSIS — M4727 Other spondylosis with radiculopathy, lumbosacral region: Secondary | ICD-10-CM | POA: Diagnosis not present

## 2022-05-10 DIAGNOSIS — J101 Influenza due to other identified influenza virus with other respiratory manifestations: Secondary | ICD-10-CM | POA: Diagnosis not present

## 2022-05-10 DIAGNOSIS — R509 Fever, unspecified: Secondary | ICD-10-CM | POA: Diagnosis not present

## 2022-05-19 DIAGNOSIS — Z79891 Long term (current) use of opiate analgesic: Secondary | ICD-10-CM | POA: Diagnosis not present

## 2022-05-19 DIAGNOSIS — M545 Low back pain, unspecified: Secondary | ICD-10-CM | POA: Diagnosis not present

## 2022-05-19 DIAGNOSIS — M4727 Other spondylosis with radiculopathy, lumbosacral region: Secondary | ICD-10-CM | POA: Diagnosis not present

## 2022-05-31 DIAGNOSIS — H6690 Otitis media, unspecified, unspecified ear: Secondary | ICD-10-CM | POA: Diagnosis not present

## 2022-06-15 ENCOUNTER — Other Ambulatory Visit: Payer: Self-pay | Admitting: Nurse Practitioner

## 2022-06-15 DIAGNOSIS — M545 Low back pain, unspecified: Secondary | ICD-10-CM | POA: Diagnosis not present

## 2022-06-15 DIAGNOSIS — M4727 Other spondylosis with radiculopathy, lumbosacral region: Secondary | ICD-10-CM | POA: Diagnosis not present

## 2022-06-15 DIAGNOSIS — Z79891 Long term (current) use of opiate analgesic: Secondary | ICD-10-CM | POA: Diagnosis not present

## 2022-06-22 DIAGNOSIS — E039 Hypothyroidism, unspecified: Secondary | ICD-10-CM | POA: Diagnosis not present

## 2022-06-22 DIAGNOSIS — M549 Dorsalgia, unspecified: Secondary | ICD-10-CM | POA: Diagnosis not present

## 2022-06-22 DIAGNOSIS — E2740 Unspecified adrenocortical insufficiency: Secondary | ICD-10-CM | POA: Diagnosis not present

## 2022-06-22 DIAGNOSIS — E785 Hyperlipidemia, unspecified: Secondary | ICD-10-CM | POA: Diagnosis not present

## 2022-06-22 DIAGNOSIS — F325 Major depressive disorder, single episode, in full remission: Secondary | ICD-10-CM | POA: Diagnosis not present

## 2022-06-22 DIAGNOSIS — E1169 Type 2 diabetes mellitus with other specified complication: Secondary | ICD-10-CM | POA: Diagnosis not present

## 2022-06-22 DIAGNOSIS — R0981 Nasal congestion: Secondary | ICD-10-CM | POA: Diagnosis not present

## 2022-06-22 DIAGNOSIS — I10 Essential (primary) hypertension: Secondary | ICD-10-CM | POA: Diagnosis not present

## 2022-06-22 DIAGNOSIS — G43109 Migraine with aura, not intractable, without status migrainosus: Secondary | ICD-10-CM | POA: Diagnosis not present

## 2022-06-22 DIAGNOSIS — Z79899 Other long term (current) drug therapy: Secondary | ICD-10-CM | POA: Diagnosis not present

## 2022-07-05 DIAGNOSIS — E039 Hypothyroidism, unspecified: Secondary | ICD-10-CM | POA: Diagnosis not present

## 2022-07-07 DIAGNOSIS — Z1231 Encounter for screening mammogram for malignant neoplasm of breast: Secondary | ICD-10-CM | POA: Diagnosis not present

## 2022-07-13 DIAGNOSIS — M4727 Other spondylosis with radiculopathy, lumbosacral region: Secondary | ICD-10-CM | POA: Diagnosis not present

## 2022-07-15 ENCOUNTER — Encounter: Payer: Self-pay | Admitting: Nurse Practitioner

## 2022-07-15 ENCOUNTER — Ambulatory Visit
Admission: RE | Admit: 2022-07-15 | Discharge: 2022-07-15 | Disposition: A | Discharge status: Home / Self Care | Payer: Medicare Other | Source: Ambulatory Visit | Attending: Nurse Practitioner | Admitting: Nurse Practitioner

## 2022-07-15 ENCOUNTER — Inpatient Hospital Stay: Admission: RE | Admit: 2022-07-15 | Payer: Medicare Other | Source: Ambulatory Visit

## 2022-07-15 DIAGNOSIS — M4727 Other spondylosis with radiculopathy, lumbosacral region: Secondary | ICD-10-CM

## 2022-07-16 ENCOUNTER — Ambulatory Visit
Admission: RE | Admit: 2022-07-16 | Discharge: 2022-07-16 | Disposition: A | Discharge status: Home / Self Care | Payer: Medicare Other | Source: Ambulatory Visit | Attending: Nurse Practitioner | Admitting: Nurse Practitioner

## 2022-07-16 DIAGNOSIS — M79605 Pain in left leg: Secondary | ICD-10-CM | POA: Diagnosis not present

## 2022-07-16 DIAGNOSIS — M545 Low back pain, unspecified: Secondary | ICD-10-CM | POA: Diagnosis not present

## 2022-08-10 DIAGNOSIS — M47816 Spondylosis without myelopathy or radiculopathy, lumbar region: Secondary | ICD-10-CM | POA: Diagnosis not present

## 2022-08-11 ENCOUNTER — Telehealth: Payer: Self-pay | Admitting: Pharmacy Technician

## 2022-08-11 DIAGNOSIS — Z596 Low income: Secondary | ICD-10-CM

## 2022-08-11 NOTE — Progress Notes (Signed)
[facility])                                            [place] Team    08/11/2022  Latoya Lee 1971-05-14 [id number]                                      Medication Assistance Referral  Referral From:  [facility], Pharmacy Student)  Medication/Company: [name] / Novo Nordisk Patient application portion:  Mailed Provider application portion: Faxed  to Dr. Ernestene Kiel Provider address/fax verified via: Office website  [name] P. [name], [name]  [phone]

## 2022-08-25 ENCOUNTER — Telehealth: Payer: Self-pay | Admitting: Pharmacy Technician

## 2022-08-25 DIAGNOSIS — Z596 Low income: Secondary | ICD-10-CM

## 2022-08-25 NOTE — Progress Notes (Signed)
[facility])                                            [place] Team    08/25/2022  Latoya Lee 1971-02-04 [id number]  Received both patient and provider portion(s) of patient assistance application(s) for Ozempic. Faxed completed application and required documents into [employer].    [name] P. [name], [name]  [phone]

## 2022-09-01 DIAGNOSIS — M47816 Spondylosis without myelopathy or radiculopathy, lumbar region: Secondary | ICD-10-CM | POA: Diagnosis not present

## 2022-09-05 ENCOUNTER — Telehealth: Payer: Self-pay | Admitting: Pharmacy Technician

## 2022-09-05 DIAGNOSIS — Z596 Low income: Secondary | ICD-10-CM

## 2022-09-05 NOTE — Progress Notes (Signed)
[facility])                                            [place] Team    09/05/2022  Latoya Lee [birth year] [id number]  Care coordination call placed to [place] in regard to [place] application.   Spoke to [place] who informs patient is APPROVED 03/12/202424-06/20/23. Medication will automatically fill and ship to provider's address on file initially and going forward in 2024. Patient may call [place] at [phone] if shipment has not arrived and patient does not have sufficient supply.  [name] P. Tarin Navarez, [place]  [phone]

## 2022-09-20 DIAGNOSIS — R000 Tachycardia, unspecified: Secondary | ICD-10-CM | POA: Diagnosis not present

## 2022-09-20 DIAGNOSIS — E039 Hypothyroidism, unspecified: Secondary | ICD-10-CM | POA: Diagnosis not present

## 2022-09-20 DIAGNOSIS — R079 Chest pain, unspecified: Secondary | ICD-10-CM | POA: Diagnosis not present

## 2022-09-20 DIAGNOSIS — I10 Essential (primary) hypertension: Secondary | ICD-10-CM | POA: Diagnosis not present

## 2022-09-20 DIAGNOSIS — E7800 Pure hypercholesterolemia, unspecified: Secondary | ICD-10-CM | POA: Diagnosis not present

## 2022-09-20 DIAGNOSIS — A0811 Acute gastroenteropathy due to Norwalk agent: Secondary | ICD-10-CM | POA: Diagnosis not present

## 2022-09-23 ENCOUNTER — Telehealth: Payer: Self-pay

## 2022-09-23 NOTE — Telephone Encounter (Signed)
     Patient  visit on 4/2  at [facility]  Have you been able to follow up with your primary care physician? Yes   The patient was or was not able to obtain any needed medicine or equipment. Yes   Are there diet recommendations that you are having difficulty following? Na   Patient expresses understanding of discharge instructions and education provided has no other needs at this time. Yes       Lenn [facility], [place] [phone] [address], [place], [place] [address] Phone: [phone] Email: [email or web address]    

## 2022-09-23 NOTE — Telephone Encounter (Signed)
        Patient  visited [place] on 4/2     Telephone encounter attempt :  1st  A HIPAA compliant voice message was left requesting a return call.  Instructed patient to call back .    [facility] Guide, [facility] [phone] [address] [place], [place], [place] [address] Phone: [phone] Email: [email or web address]

## 2022-09-28 DIAGNOSIS — Z79891 Long term (current) use of opiate analgesic: Secondary | ICD-10-CM | POA: Diagnosis not present

## 2022-09-28 DIAGNOSIS — M47816 Spondylosis without myelopathy or radiculopathy, lumbar region: Secondary | ICD-10-CM | POA: Diagnosis not present

## 2022-10-03 DIAGNOSIS — Z79899 Other long term (current) drug therapy: Secondary | ICD-10-CM | POA: Diagnosis not present

## 2022-10-03 DIAGNOSIS — E1169 Type 2 diabetes mellitus with other specified complication: Secondary | ICD-10-CM | POA: Diagnosis not present

## 2022-10-03 DIAGNOSIS — E039 Hypothyroidism, unspecified: Secondary | ICD-10-CM | POA: Diagnosis not present

## 2022-10-03 DIAGNOSIS — E785 Hyperlipidemia, unspecified: Secondary | ICD-10-CM | POA: Diagnosis not present

## 2022-10-26 DIAGNOSIS — M7060 Trochanteric bursitis, unspecified hip: Secondary | ICD-10-CM | POA: Diagnosis not present

## 2022-11-01 DIAGNOSIS — I10 Essential (primary) hypertension: Secondary | ICD-10-CM | POA: Diagnosis not present

## 2022-11-01 DIAGNOSIS — G43109 Migraine with aura, not intractable, without status migrainosus: Secondary | ICD-10-CM | POA: Diagnosis not present

## 2022-11-01 DIAGNOSIS — E785 Hyperlipidemia, unspecified: Secondary | ICD-10-CM | POA: Diagnosis not present

## 2022-11-01 DIAGNOSIS — E1169 Type 2 diabetes mellitus with other specified complication: Secondary | ICD-10-CM | POA: Diagnosis not present

## 2022-11-03 DIAGNOSIS — G43109 Migraine with aura, not intractable, without status migrainosus: Secondary | ICD-10-CM | POA: Diagnosis not present

## 2022-11-23 DIAGNOSIS — M7060 Trochanteric bursitis, unspecified hip: Secondary | ICD-10-CM | POA: Diagnosis not present

## 2022-12-21 DIAGNOSIS — M5431 Sciatica, right side: Secondary | ICD-10-CM | POA: Diagnosis not present

## 2023-01-18 DIAGNOSIS — M5417 Radiculopathy, lumbosacral region: Secondary | ICD-10-CM | POA: Diagnosis not present

## 2023-01-18 DIAGNOSIS — Z79891 Long term (current) use of opiate analgesic: Secondary | ICD-10-CM | POA: Diagnosis not present

## 2023-02-15 DIAGNOSIS — M5417 Radiculopathy, lumbosacral region: Secondary | ICD-10-CM | POA: Diagnosis not present

## 2023-03-06 DIAGNOSIS — M5416 Radiculopathy, lumbar region: Secondary | ICD-10-CM | POA: Diagnosis not present

## 2023-03-15 DIAGNOSIS — M5417 Radiculopathy, lumbosacral region: Secondary | ICD-10-CM | POA: Diagnosis not present

## 2023-04-12 DIAGNOSIS — M533 Sacrococcygeal disorders, not elsewhere classified: Secondary | ICD-10-CM | POA: Diagnosis not present

## 2023-04-24 DIAGNOSIS — M533 Sacrococcygeal disorders, not elsewhere classified: Secondary | ICD-10-CM | POA: Diagnosis not present

## 2023-05-10 DIAGNOSIS — M5416 Radiculopathy, lumbar region: Secondary | ICD-10-CM | POA: Diagnosis not present

## 2023-05-11 DIAGNOSIS — I10 Essential (primary) hypertension: Secondary | ICD-10-CM | POA: Diagnosis not present

## 2023-05-11 DIAGNOSIS — E1169 Type 2 diabetes mellitus with other specified complication: Secondary | ICD-10-CM | POA: Diagnosis not present

## 2023-05-11 DIAGNOSIS — Z0000 Encounter for general adult medical examination without abnormal findings: Secondary | ICD-10-CM | POA: Diagnosis not present

## 2023-05-11 DIAGNOSIS — Z6832 Body mass index (BMI) 32.0-32.9, adult: Secondary | ICD-10-CM | POA: Diagnosis not present

## 2023-05-11 DIAGNOSIS — G4700 Insomnia, unspecified: Secondary | ICD-10-CM | POA: Diagnosis not present

## 2023-05-11 DIAGNOSIS — E039 Hypothyroidism, unspecified: Secondary | ICD-10-CM | POA: Diagnosis not present

## 2023-05-11 DIAGNOSIS — E785 Hyperlipidemia, unspecified: Secondary | ICD-10-CM | POA: Diagnosis not present

## 2023-05-11 DIAGNOSIS — Z7189 Other specified counseling: Secondary | ICD-10-CM | POA: Diagnosis not present

## 2023-05-11 DIAGNOSIS — Z1331 Encounter for screening for depression: Secondary | ICD-10-CM | POA: Diagnosis not present

## 2023-05-30 ENCOUNTER — Other Ambulatory Visit (HOSPITAL_COMMUNITY): Payer: Self-pay

## 2023-05-30 MED ORDER — MORPHINE SULFATE ER 15 MG PO TBCR
15.0000 mg | EXTENDED_RELEASE_TABLET | Freq: Two times a day (BID) | ORAL | 0 refills | Status: AC
  Filled 2023-05-30: qty 20, 10d supply, fill #0
  Filled 2023-05-30: qty 40, 20d supply, fill #0

## 2023-06-08 DIAGNOSIS — Z79891 Long term (current) use of opiate analgesic: Secondary | ICD-10-CM | POA: Diagnosis not present

## 2023-06-08 DIAGNOSIS — M5416 Radiculopathy, lumbar region: Secondary | ICD-10-CM | POA: Diagnosis not present

## 2023-07-10 DIAGNOSIS — M5416 Radiculopathy, lumbar region: Secondary | ICD-10-CM | POA: Diagnosis not present

## 2023-08-17 DIAGNOSIS — M5416 Radiculopathy, lumbar region: Secondary | ICD-10-CM | POA: Diagnosis not present

## 2023-08-31 DIAGNOSIS — K080 Exfoliation of teeth due to systemic causes: Secondary | ICD-10-CM | POA: Diagnosis not present

## 2023-09-01 ENCOUNTER — Other Ambulatory Visit (HOSPITAL_COMMUNITY): Payer: Self-pay

## 2023-09-14 DIAGNOSIS — M5416 Radiculopathy, lumbar region: Secondary | ICD-10-CM | POA: Diagnosis not present

## 2023-10-17 DIAGNOSIS — M47816 Spondylosis without myelopathy or radiculopathy, lumbar region: Secondary | ICD-10-CM | POA: Diagnosis not present

## 2023-11-14 DIAGNOSIS — M47816 Spondylosis without myelopathy or radiculopathy, lumbar region: Secondary | ICD-10-CM | POA: Diagnosis not present

## 2023-11-14 DIAGNOSIS — Z79891 Long term (current) use of opiate analgesic: Secondary | ICD-10-CM | POA: Diagnosis not present

## 2023-11-15 DIAGNOSIS — Z1211 Encounter for screening for malignant neoplasm of colon: Secondary | ICD-10-CM | POA: Diagnosis not present

## 2023-11-15 DIAGNOSIS — Z1212 Encounter for screening for malignant neoplasm of rectum: Secondary | ICD-10-CM | POA: Diagnosis not present

## 2023-12-07 DIAGNOSIS — I10 Essential (primary) hypertension: Secondary | ICD-10-CM | POA: Diagnosis not present

## 2023-12-07 DIAGNOSIS — E1169 Type 2 diabetes mellitus with other specified complication: Secondary | ICD-10-CM | POA: Diagnosis not present

## 2023-12-07 DIAGNOSIS — E039 Hypothyroidism, unspecified: Secondary | ICD-10-CM | POA: Diagnosis not present

## 2023-12-07 DIAGNOSIS — E785 Hyperlipidemia, unspecified: Secondary | ICD-10-CM | POA: Diagnosis not present

## 2023-12-13 DIAGNOSIS — M47816 Spondylosis without myelopathy or radiculopathy, lumbar region: Secondary | ICD-10-CM | POA: Diagnosis not present

## 2024-01-04 DIAGNOSIS — M47816 Spondylosis without myelopathy or radiculopathy, lumbar region: Secondary | ICD-10-CM | POA: Diagnosis not present

## 2024-02-07 DIAGNOSIS — M47816 Spondylosis without myelopathy or radiculopathy, lumbar region: Secondary | ICD-10-CM | POA: Diagnosis not present

## 2024-03-05 DIAGNOSIS — M47816 Spondylosis without myelopathy or radiculopathy, lumbar region: Secondary | ICD-10-CM | POA: Diagnosis not present

## 2024-03-26 DIAGNOSIS — M47816 Spondylosis without myelopathy or radiculopathy, lumbar region: Secondary | ICD-10-CM | POA: Diagnosis not present

## 2024-03-27 DIAGNOSIS — E119 Type 2 diabetes mellitus without complications: Secondary | ICD-10-CM | POA: Diagnosis not present

## 2024-04-30 DIAGNOSIS — M47816 Spondylosis without myelopathy or radiculopathy, lumbar region: Secondary | ICD-10-CM | POA: Diagnosis not present

## 2024-04-30 DIAGNOSIS — Z79891 Long term (current) use of opiate analgesic: Secondary | ICD-10-CM | POA: Diagnosis not present

## 2024-05-28 DIAGNOSIS — M47816 Spondylosis without myelopathy or radiculopathy, lumbar region: Secondary | ICD-10-CM | POA: Diagnosis not present
